# Patient Record
Sex: Female | Born: 1945 | Hispanic: No | Smoking: Never smoker
Health system: Southern US, Community
[De-identification: ages and names within clinical notes are randomized; demographics above are authoritative.]

## PROBLEM LIST (undated history)

## (undated) DIAGNOSIS — I10 Essential (primary) hypertension: Secondary | ICD-10-CM

## (undated) HISTORY — DX: Essential (primary) hypertension: I10

---

## 1999-01-14 ENCOUNTER — Other Ambulatory Visit: Admission: RE | Admit: 1999-01-14 | Discharge: 1999-01-14 | Payer: Self-pay | Admitting: Family Medicine

## 1999-12-16 ENCOUNTER — Other Ambulatory Visit: Admission: RE | Admit: 1999-12-16 | Discharge: 1999-12-16 | Payer: Self-pay | Admitting: *Deleted

## 2003-05-21 ENCOUNTER — Other Ambulatory Visit: Admission: RE | Admit: 2003-05-21 | Discharge: 2003-05-21 | Payer: Self-pay | Admitting: Family Medicine

## 2003-09-27 ENCOUNTER — Ambulatory Visit: Admission: RE | Admit: 2003-09-27 | Discharge: 2003-09-27 | Payer: Self-pay | Admitting: Family Medicine

## 2005-08-04 ENCOUNTER — Other Ambulatory Visit: Admission: RE | Admit: 2005-08-04 | Discharge: 2005-08-04 | Payer: Self-pay | Admitting: Family Medicine

## 2006-09-24 ENCOUNTER — Other Ambulatory Visit: Admission: RE | Admit: 2006-09-24 | Discharge: 2006-09-24 | Payer: Self-pay | Admitting: Family Medicine

## 2007-09-27 ENCOUNTER — Other Ambulatory Visit: Admission: RE | Admit: 2007-09-27 | Discharge: 2007-09-27 | Payer: Self-pay | Admitting: Family Medicine

## 2008-09-28 ENCOUNTER — Other Ambulatory Visit: Admission: RE | Admit: 2008-09-28 | Discharge: 2008-09-28 | Payer: Self-pay | Admitting: Family Medicine

## 2008-11-05 ENCOUNTER — Encounter: Admission: RE | Admit: 2008-11-05 | Discharge: 2008-11-05 | Payer: Self-pay | Admitting: Gastroenterology

## 2009-10-18 ENCOUNTER — Other Ambulatory Visit: Admission: RE | Admit: 2009-10-18 | Discharge: 2009-10-18 | Payer: Self-pay | Admitting: Family Medicine

## 2010-03-18 IMAGING — US US ABDOMEN COMPLETE
1 series · 14 of 25 positions shown · non-contrast
Comparison: None.

CLINICAL DATA: Abdominal pain and weight loss.

COMPLETE ABDOMINAL ULTRASOUND

[Series 1: us abdomen complete · 0.20mm/px · 14 of 86 slices shown]
[im 1/86]
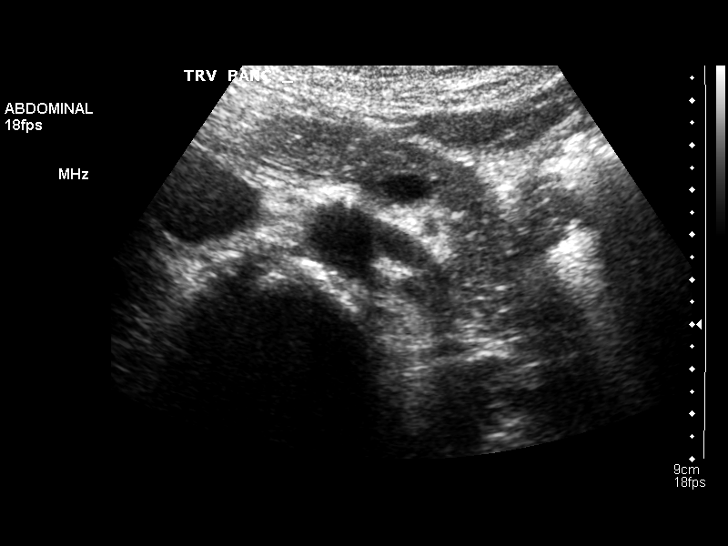
[im 8/86]
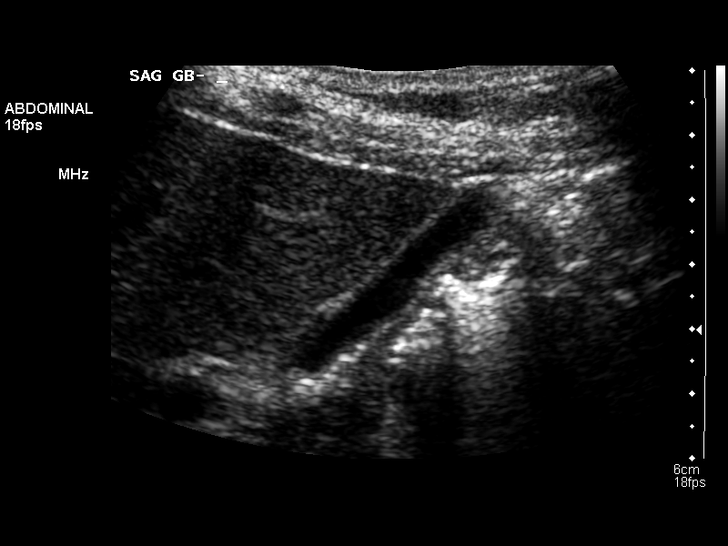
[im 15/86]
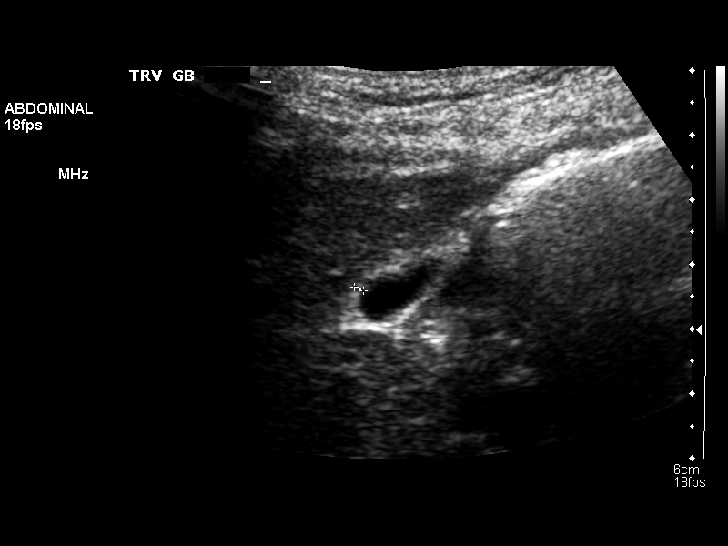
[im 22/86]
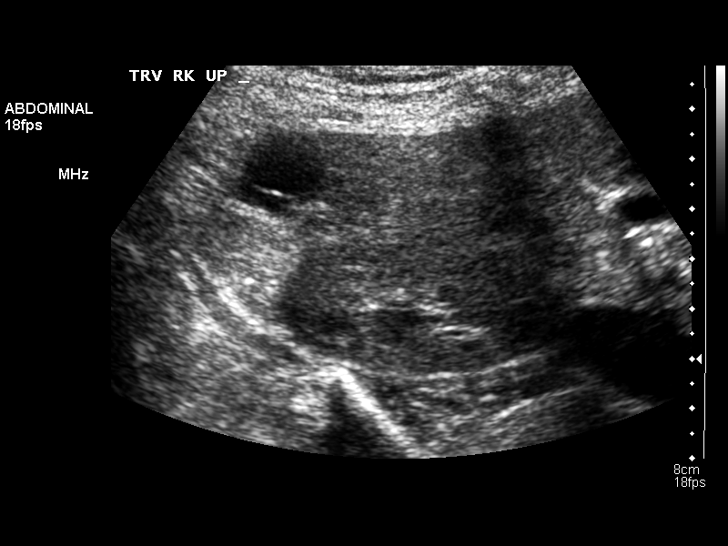
[im 29/86]
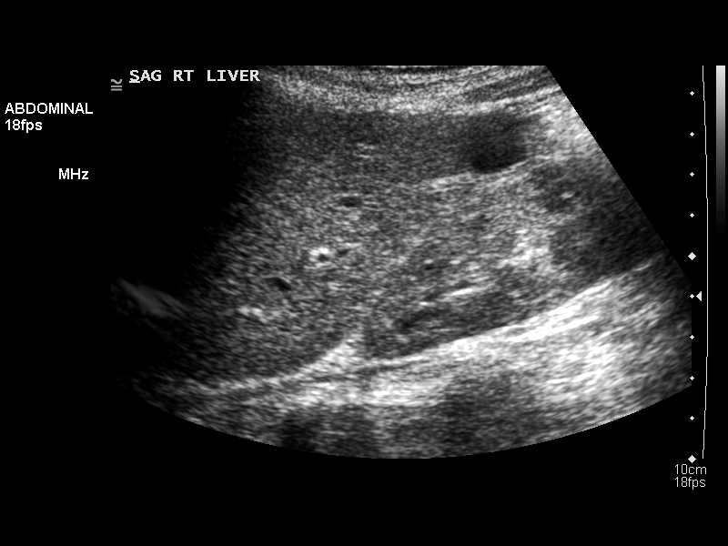
[im 32/86]
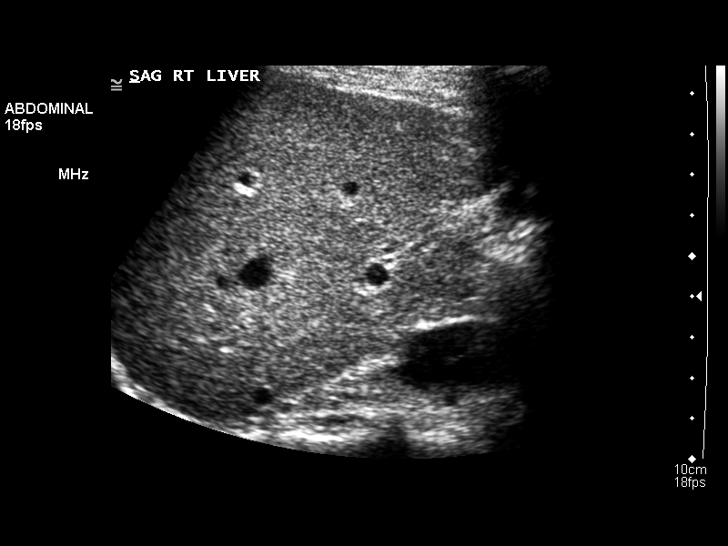
[im 39/86]
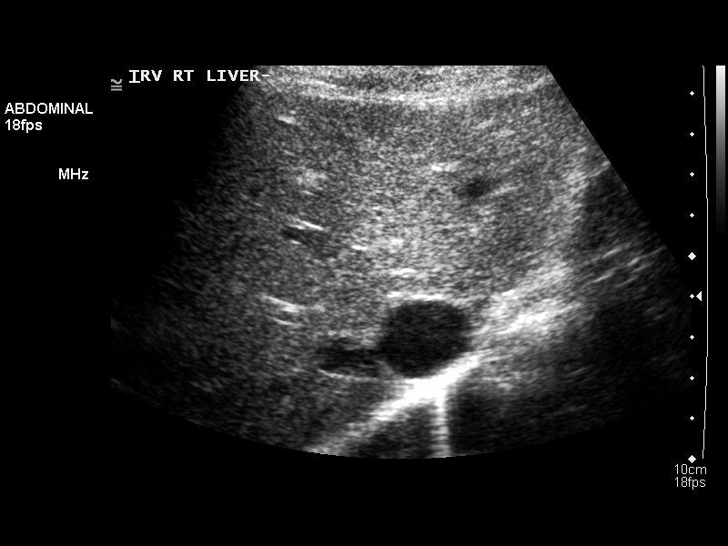
[im 47/86]
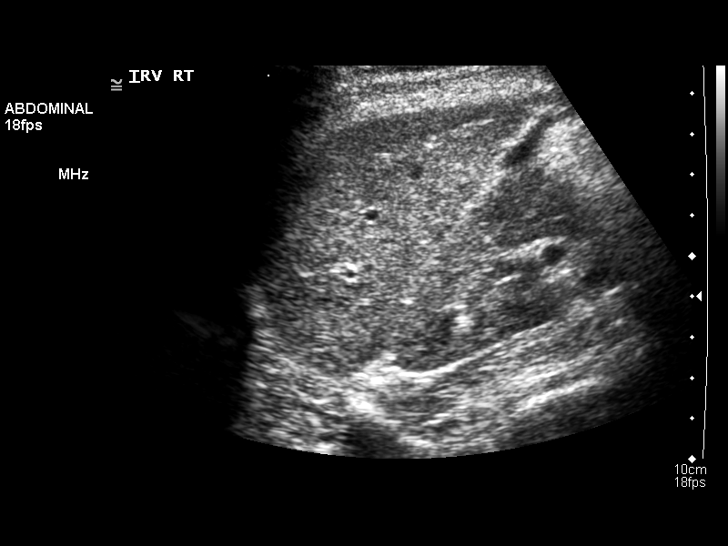
[im 54/86]
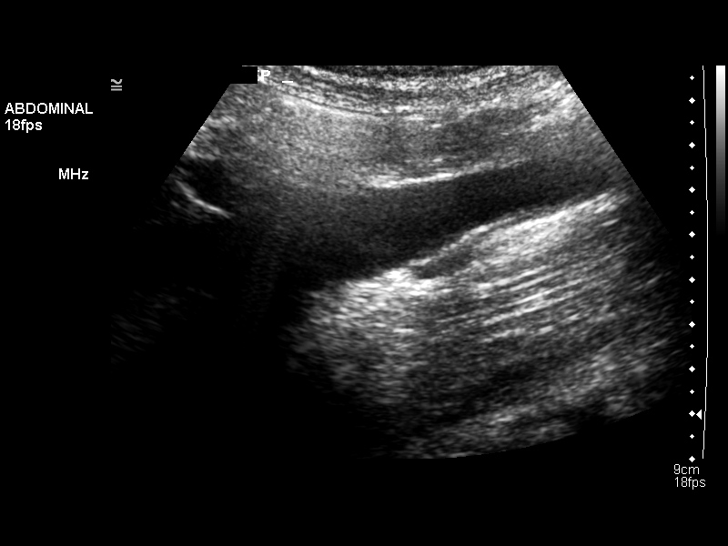
[im 57/86]
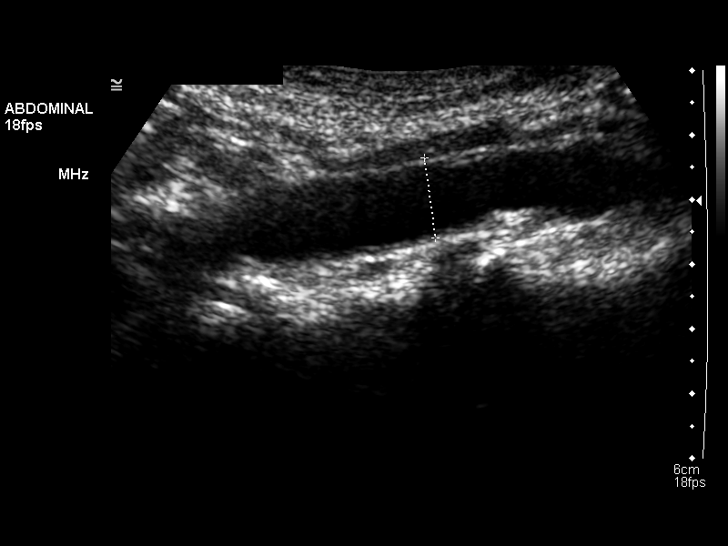
[im 64/86]
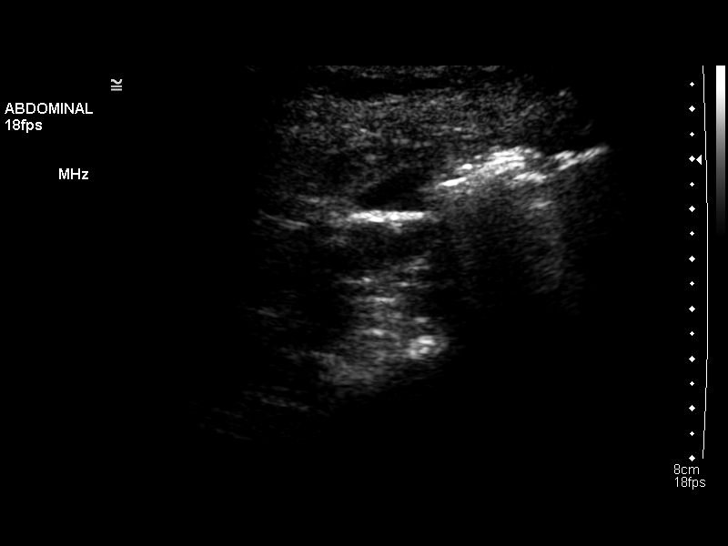
[im 71/86]
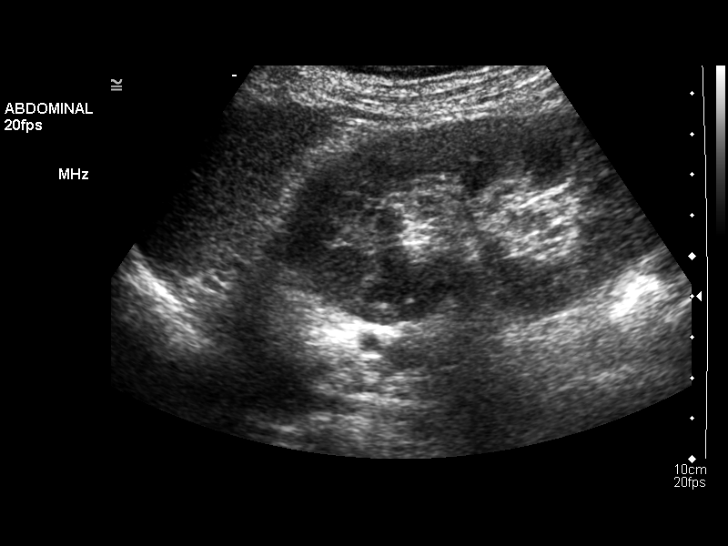
[im 78/86]
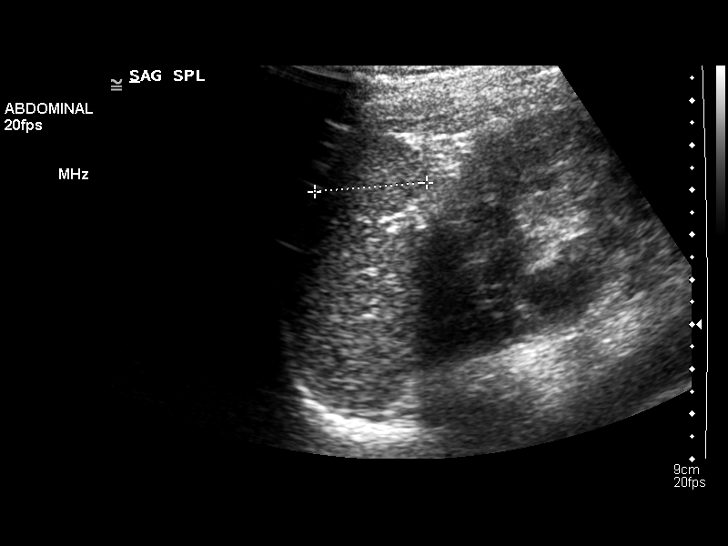
[im 86/86]
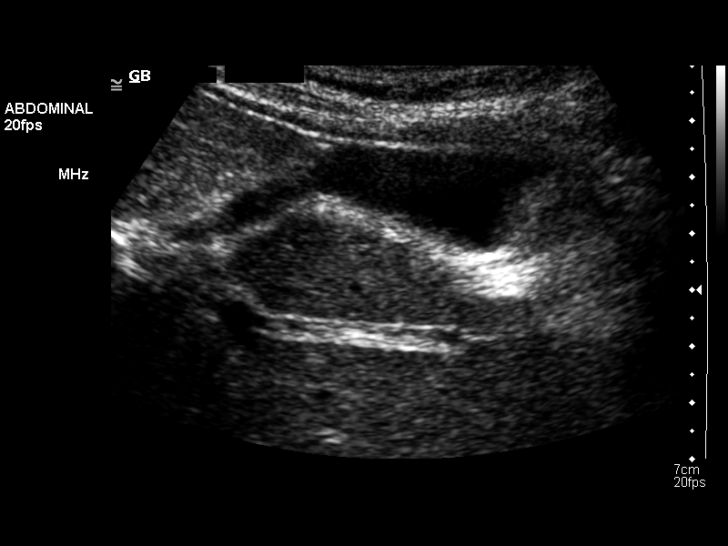

[14 of 25 positions shown; findings below may reference images not displayed]

FINDINGS: Gallbladder:  Negative.

Common bile duct:  3 mm, within normal limits.

Liver:  Contains a 3.1 x 2.0 x 2.3 cm cyst in the right lobe.
Otherwise negative.

IVC:  Visualized.

Pancreas:  Negative.

Spleen:  2.5 cm, negative.

Right Kidney:  10.4 cm, negative.

Left Kidney:  10.5 cm, negative.

Abdominal aorta:  1.8 cm.
IMPRESSION: No acute findings.

## 2011-04-03 ENCOUNTER — Other Ambulatory Visit (HOSPITAL_COMMUNITY)
Admission: RE | Admit: 2011-04-03 | Discharge: 2011-04-03 | Disposition: A | Discharge status: Home / Self Care | Payer: Managed Care, Other (non HMO) | Source: Ambulatory Visit | Attending: Family Medicine | Admitting: Family Medicine

## 2011-04-03 ENCOUNTER — Other Ambulatory Visit: Payer: Self-pay | Admitting: Family Medicine

## 2011-04-03 DIAGNOSIS — Z124 Encounter for screening for malignant neoplasm of cervix: Secondary | ICD-10-CM | POA: Insufficient documentation

## 2012-09-22 ENCOUNTER — Ambulatory Visit
Admission: RE | Admit: 2012-09-22 | Discharge: 2012-09-22 | Disposition: A | Discharge status: Home / Self Care | Payer: Managed Care, Other (non HMO) | Source: Ambulatory Visit

## 2012-09-22 ENCOUNTER — Other Ambulatory Visit: Payer: Self-pay

## 2012-09-22 DIAGNOSIS — R609 Edema, unspecified: Secondary | ICD-10-CM

## 2013-07-11 DIAGNOSIS — M069 Rheumatoid arthritis, unspecified: Secondary | ICD-10-CM | POA: Diagnosis not present

## 2013-07-11 DIAGNOSIS — M159 Polyosteoarthritis, unspecified: Secondary | ICD-10-CM | POA: Diagnosis not present

## 2013-08-14 DIAGNOSIS — J40 Bronchitis, not specified as acute or chronic: Secondary | ICD-10-CM | POA: Diagnosis not present

## 2013-09-11 DIAGNOSIS — M159 Polyosteoarthritis, unspecified: Secondary | ICD-10-CM | POA: Diagnosis not present

## 2013-09-11 DIAGNOSIS — M069 Rheumatoid arthritis, unspecified: Secondary | ICD-10-CM | POA: Diagnosis not present

## 2013-11-27 DIAGNOSIS — H00019 Hordeolum externum unspecified eye, unspecified eyelid: Secondary | ICD-10-CM | POA: Diagnosis not present

## 2013-11-27 DIAGNOSIS — I10 Essential (primary) hypertension: Secondary | ICD-10-CM | POA: Diagnosis not present

## 2013-12-11 DIAGNOSIS — M069 Rheumatoid arthritis, unspecified: Secondary | ICD-10-CM | POA: Diagnosis not present

## 2013-12-11 DIAGNOSIS — M159 Polyosteoarthritis, unspecified: Secondary | ICD-10-CM | POA: Diagnosis not present

## 2013-12-15 DIAGNOSIS — H01009 Unspecified blepharitis unspecified eye, unspecified eyelid: Secondary | ICD-10-CM | POA: Diagnosis not present

## 2013-12-15 DIAGNOSIS — H04129 Dry eye syndrome of unspecified lacrimal gland: Secondary | ICD-10-CM | POA: Diagnosis not present

## 2014-01-12 DIAGNOSIS — H25019 Cortical age-related cataract, unspecified eye: Secondary | ICD-10-CM | POA: Diagnosis not present

## 2014-01-12 DIAGNOSIS — H04129 Dry eye syndrome of unspecified lacrimal gland: Secondary | ICD-10-CM | POA: Diagnosis not present

## 2014-01-12 DIAGNOSIS — H2510 Age-related nuclear cataract, unspecified eye: Secondary | ICD-10-CM | POA: Diagnosis not present

## 2014-02-02 IMAGING — CR DG HAND 2V*L*
2 series · 2 of 2 positions shown · non-contrast
Comparison: None.

CLINICAL DATA: Swelling of the interphalangeal joints.  No trauma.

LEFT HAND - 2 VIEW

[view not recorded (1 of 2)]
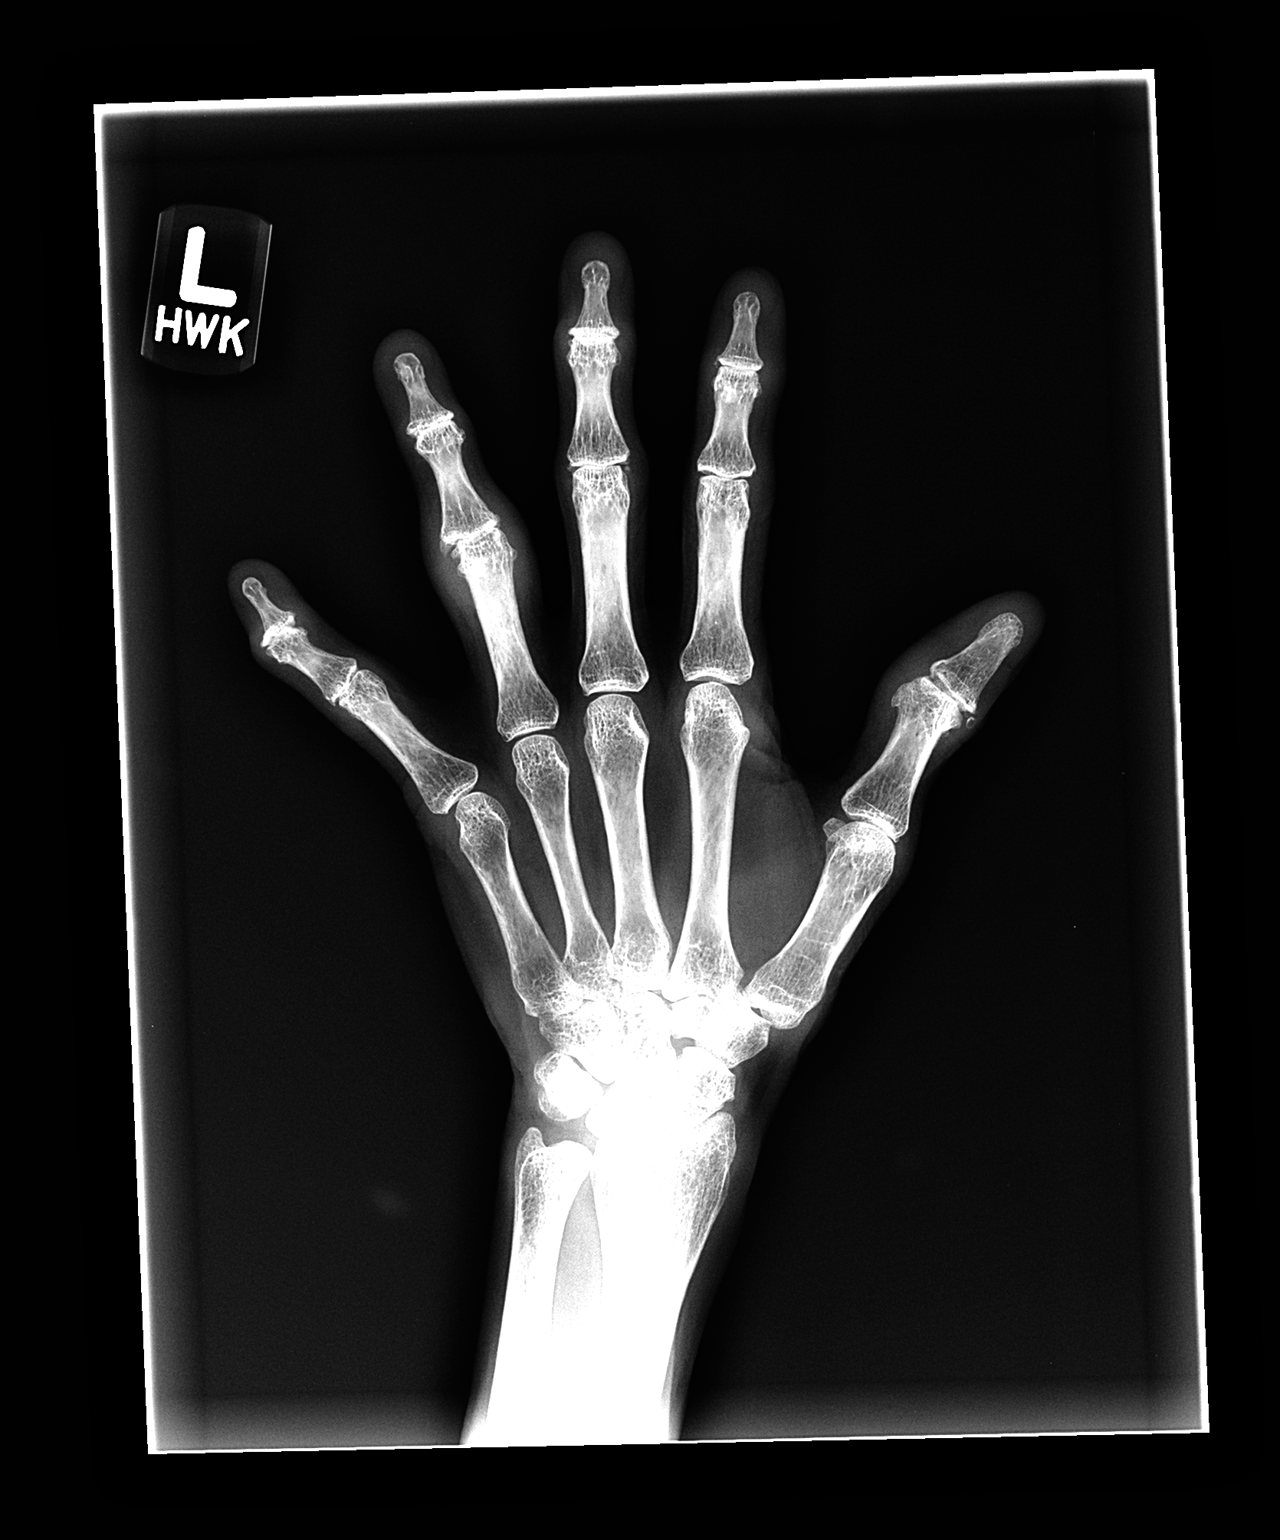

[view not recorded (2 of 2)]
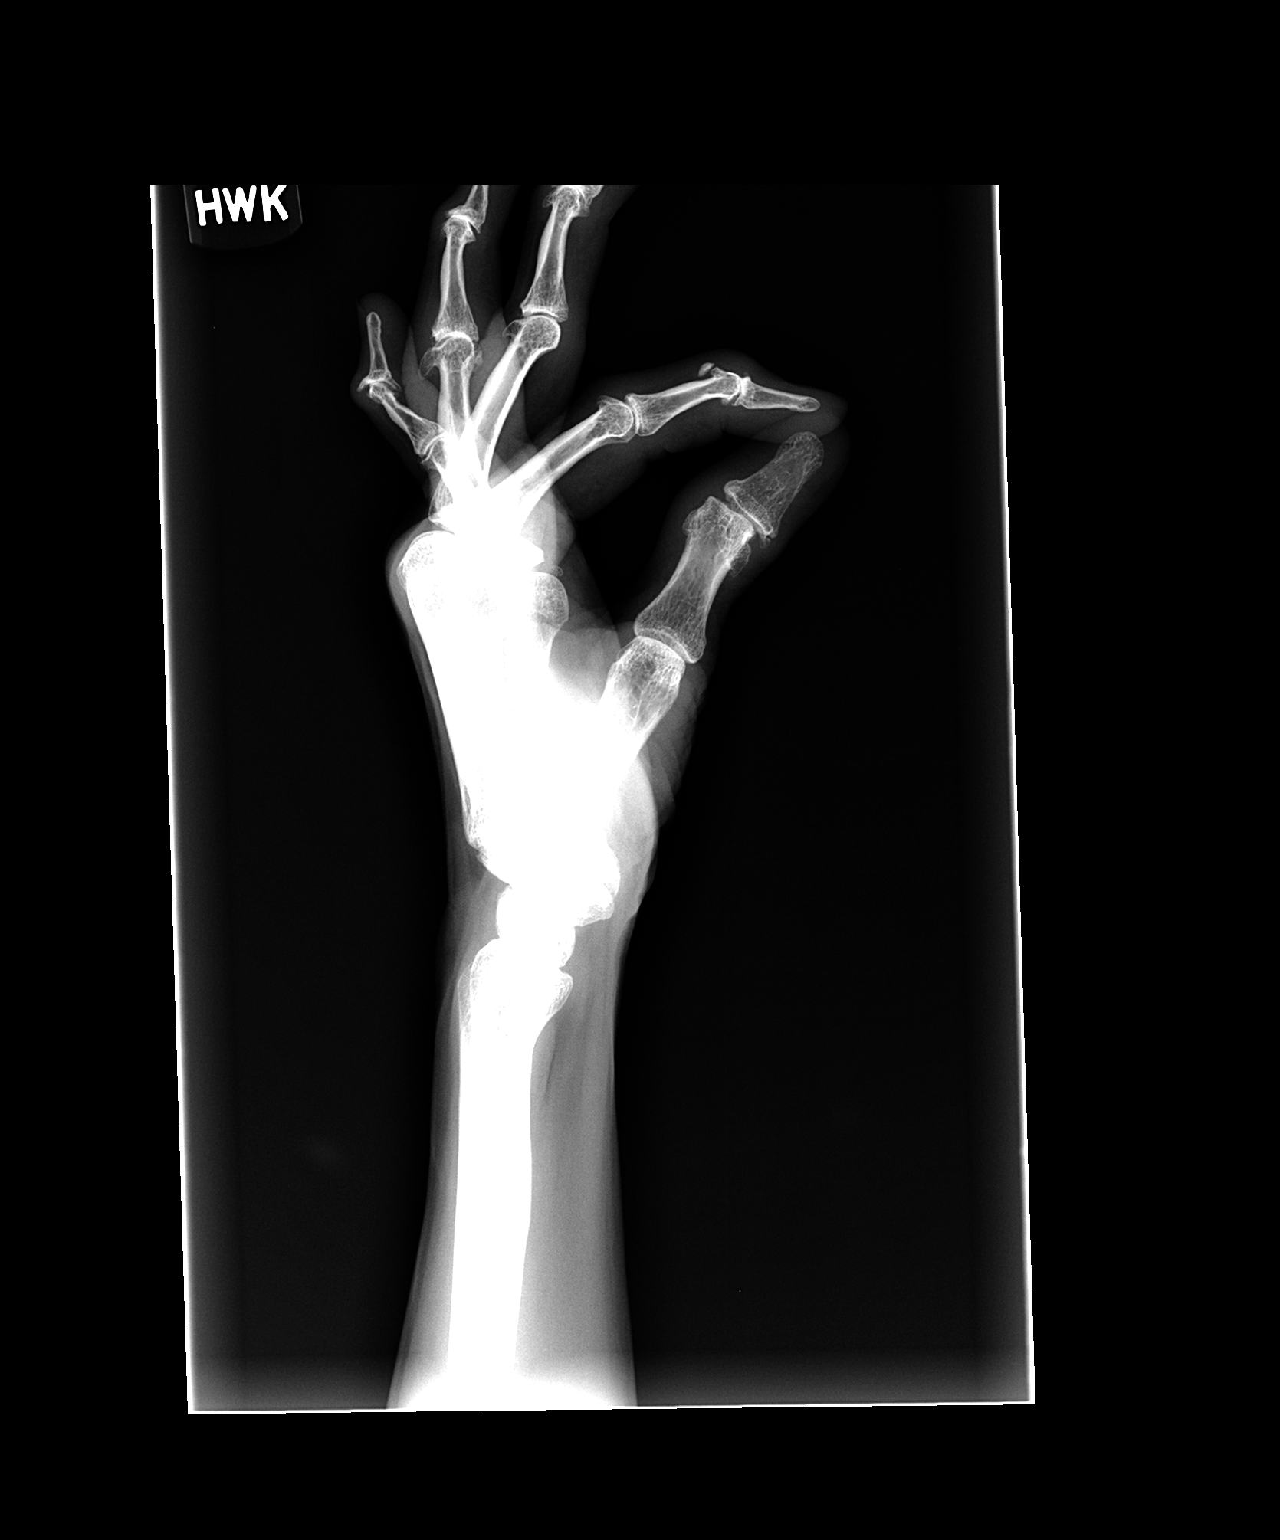

[2 of 2 positions shown; findings below may reference images not displayed]

FINDINGS: There is significant joint space narrowing with
subchondral sclerosis and marginal osteophytes involving all DIP
joints, and the IP joint of the thumb.  The PIP joints are also
affected most prominently the fourth finger, where there is
significant surrounding soft tissue swelling.  The metacarpal
phalangeal joints are preserved as are the intercarpal and carpal
metacarpal articulations.

No fracture.
IMPRESSION: Findings are most consistent with osteoarthritis involving
interphalangeal joints as detailed above.  No fracture or acute
finding.

## 2014-02-16 DIAGNOSIS — Z23 Encounter for immunization: Secondary | ICD-10-CM | POA: Diagnosis not present

## 2014-04-12 DIAGNOSIS — R030 Elevated blood-pressure reading, without diagnosis of hypertension: Secondary | ICD-10-CM | POA: Diagnosis not present

## 2014-05-02 DIAGNOSIS — Z1231 Encounter for screening mammogram for malignant neoplasm of breast: Secondary | ICD-10-CM | POA: Diagnosis not present

## 2014-05-30 DIAGNOSIS — Z131 Encounter for screening for diabetes mellitus: Secondary | ICD-10-CM | POA: Diagnosis not present

## 2014-05-30 DIAGNOSIS — N898 Other specified noninflammatory disorders of vagina: Secondary | ICD-10-CM | POA: Diagnosis not present

## 2014-05-30 DIAGNOSIS — Z23 Encounter for immunization: Secondary | ICD-10-CM | POA: Diagnosis not present

## 2014-05-30 DIAGNOSIS — M069 Rheumatoid arthritis, unspecified: Secondary | ICD-10-CM | POA: Diagnosis not present

## 2014-05-30 DIAGNOSIS — Z136 Encounter for screening for cardiovascular disorders: Secondary | ICD-10-CM | POA: Diagnosis not present

## 2014-05-30 DIAGNOSIS — Z0000 Encounter for general adult medical examination without abnormal findings: Secondary | ICD-10-CM | POA: Diagnosis not present

## 2015-01-14 DIAGNOSIS — H2513 Age-related nuclear cataract, bilateral: Secondary | ICD-10-CM | POA: Diagnosis not present

## 2015-01-14 DIAGNOSIS — H25013 Cortical age-related cataract, bilateral: Secondary | ICD-10-CM | POA: Diagnosis not present

## 2015-01-14 DIAGNOSIS — H04123 Dry eye syndrome of bilateral lacrimal glands: Secondary | ICD-10-CM | POA: Diagnosis not present

## 2015-03-08 DIAGNOSIS — Z23 Encounter for immunization: Secondary | ICD-10-CM | POA: Diagnosis not present

## 2015-03-18 DIAGNOSIS — H25013 Cortical age-related cataract, bilateral: Secondary | ICD-10-CM | POA: Diagnosis not present

## 2015-05-08 DIAGNOSIS — H04121 Dry eye syndrome of right lacrimal gland: Secondary | ICD-10-CM | POA: Diagnosis not present

## 2015-05-08 DIAGNOSIS — H01004 Unspecified blepharitis left upper eyelid: Secondary | ICD-10-CM | POA: Diagnosis not present

## 2015-05-08 DIAGNOSIS — H04122 Dry eye syndrome of left lacrimal gland: Secondary | ICD-10-CM | POA: Diagnosis not present

## 2015-05-08 DIAGNOSIS — H01001 Unspecified blepharitis right upper eyelid: Secondary | ICD-10-CM | POA: Diagnosis not present

## 2015-05-17 DIAGNOSIS — Z1231 Encounter for screening mammogram for malignant neoplasm of breast: Secondary | ICD-10-CM | POA: Diagnosis not present

## 2015-06-12 DIAGNOSIS — I10 Essential (primary) hypertension: Secondary | ICD-10-CM | POA: Diagnosis not present

## 2015-06-12 DIAGNOSIS — M069 Rheumatoid arthritis, unspecified: Secondary | ICD-10-CM | POA: Diagnosis not present

## 2015-06-12 DIAGNOSIS — Z13220 Encounter for screening for lipoid disorders: Secondary | ICD-10-CM | POA: Diagnosis not present

## 2015-06-12 DIAGNOSIS — Z0000 Encounter for general adult medical examination without abnormal findings: Secondary | ICD-10-CM | POA: Diagnosis not present

## 2015-06-12 DIAGNOSIS — Z131 Encounter for screening for diabetes mellitus: Secondary | ICD-10-CM | POA: Diagnosis not present

## 2015-07-08 DIAGNOSIS — M79641 Pain in right hand: Secondary | ICD-10-CM | POA: Diagnosis not present

## 2015-07-08 DIAGNOSIS — M79642 Pain in left hand: Secondary | ICD-10-CM | POA: Diagnosis not present

## 2015-07-08 DIAGNOSIS — H04123 Dry eye syndrome of bilateral lacrimal glands: Secondary | ICD-10-CM | POA: Diagnosis not present

## 2015-07-08 DIAGNOSIS — M2550 Pain in unspecified joint: Secondary | ICD-10-CM | POA: Diagnosis not present

## 2015-07-08 DIAGNOSIS — M150 Primary generalized (osteo)arthritis: Secondary | ICD-10-CM | POA: Diagnosis not present

## 2015-10-02 DIAGNOSIS — H01001 Unspecified blepharitis right upper eyelid: Secondary | ICD-10-CM | POA: Diagnosis not present

## 2015-10-02 DIAGNOSIS — H01002 Unspecified blepharitis right lower eyelid: Secondary | ICD-10-CM | POA: Diagnosis not present

## 2015-10-02 DIAGNOSIS — H04123 Dry eye syndrome of bilateral lacrimal glands: Secondary | ICD-10-CM | POA: Diagnosis not present

## 2015-10-02 DIAGNOSIS — H01004 Unspecified blepharitis left upper eyelid: Secondary | ICD-10-CM | POA: Diagnosis not present

## 2016-01-15 DIAGNOSIS — H04123 Dry eye syndrome of bilateral lacrimal glands: Secondary | ICD-10-CM | POA: Diagnosis not present

## 2016-01-15 DIAGNOSIS — H25013 Cortical age-related cataract, bilateral: Secondary | ICD-10-CM | POA: Diagnosis not present

## 2016-01-15 DIAGNOSIS — H2513 Age-related nuclear cataract, bilateral: Secondary | ICD-10-CM | POA: Diagnosis not present

## 2016-01-15 DIAGNOSIS — H524 Presbyopia: Secondary | ICD-10-CM | POA: Diagnosis not present

## 2016-01-31 DIAGNOSIS — Z23 Encounter for immunization: Secondary | ICD-10-CM | POA: Diagnosis not present

## 2016-04-27 DIAGNOSIS — Z9889 Other specified postprocedural states: Secondary | ICD-10-CM

## 2016-04-27 HISTORY — PX: CATARACT EXTRACTION: SUR2

## 2016-04-27 HISTORY — DX: Other specified postprocedural states: Z98.890

## 2016-05-18 DIAGNOSIS — Z1231 Encounter for screening mammogram for malignant neoplasm of breast: Secondary | ICD-10-CM | POA: Diagnosis not present

## 2016-06-08 DIAGNOSIS — H25013 Cortical age-related cataract, bilateral: Secondary | ICD-10-CM | POA: Diagnosis not present

## 2016-06-08 DIAGNOSIS — H2513 Age-related nuclear cataract, bilateral: Secondary | ICD-10-CM | POA: Diagnosis not present

## 2016-06-12 DIAGNOSIS — I10 Essential (primary) hypertension: Secondary | ICD-10-CM | POA: Diagnosis not present

## 2016-06-12 DIAGNOSIS — Z0000 Encounter for general adult medical examination without abnormal findings: Secondary | ICD-10-CM | POA: Diagnosis not present

## 2016-06-30 DIAGNOSIS — H2511 Age-related nuclear cataract, right eye: Secondary | ICD-10-CM | POA: Diagnosis not present

## 2016-06-30 DIAGNOSIS — H25811 Combined forms of age-related cataract, right eye: Secondary | ICD-10-CM | POA: Diagnosis not present

## 2016-06-30 DIAGNOSIS — H25011 Cortical age-related cataract, right eye: Secondary | ICD-10-CM | POA: Diagnosis not present

## 2016-08-12 DIAGNOSIS — E2839 Other primary ovarian failure: Secondary | ICD-10-CM | POA: Diagnosis not present

## 2016-08-12 DIAGNOSIS — M8588 Other specified disorders of bone density and structure, other site: Secondary | ICD-10-CM | POA: Diagnosis not present

## 2016-08-18 DIAGNOSIS — H25012 Cortical age-related cataract, left eye: Secondary | ICD-10-CM | POA: Diagnosis not present

## 2016-08-18 DIAGNOSIS — H2512 Age-related nuclear cataract, left eye: Secondary | ICD-10-CM | POA: Diagnosis not present

## 2016-08-18 DIAGNOSIS — H25812 Combined forms of age-related cataract, left eye: Secondary | ICD-10-CM | POA: Diagnosis not present

## 2016-12-14 DIAGNOSIS — Z1211 Encounter for screening for malignant neoplasm of colon: Secondary | ICD-10-CM | POA: Diagnosis not present

## 2016-12-14 LAB — HM COLONOSCOPY

## 2017-02-26 DIAGNOSIS — Z23 Encounter for immunization: Secondary | ICD-10-CM | POA: Diagnosis not present

## 2017-04-16 DIAGNOSIS — F411 Generalized anxiety disorder: Secondary | ICD-10-CM | POA: Diagnosis not present

## 2017-04-16 DIAGNOSIS — I10 Essential (primary) hypertension: Secondary | ICD-10-CM | POA: Diagnosis not present

## 2017-05-19 DIAGNOSIS — Z1231 Encounter for screening mammogram for malignant neoplasm of breast: Secondary | ICD-10-CM | POA: Diagnosis not present

## 2017-06-29 DIAGNOSIS — Z5181 Encounter for therapeutic drug level monitoring: Secondary | ICD-10-CM | POA: Diagnosis not present

## 2017-06-29 DIAGNOSIS — M19041 Primary osteoarthritis, right hand: Secondary | ICD-10-CM | POA: Diagnosis not present

## 2017-06-29 DIAGNOSIS — I10 Essential (primary) hypertension: Secondary | ICD-10-CM | POA: Diagnosis not present

## 2017-06-29 DIAGNOSIS — Z0000 Encounter for general adult medical examination without abnormal findings: Secondary | ICD-10-CM | POA: Diagnosis not present

## 2017-06-29 DIAGNOSIS — F411 Generalized anxiety disorder: Secondary | ICD-10-CM | POA: Diagnosis not present

## 2017-06-29 DIAGNOSIS — Z13220 Encounter for screening for lipoid disorders: Secondary | ICD-10-CM | POA: Diagnosis not present

## 2017-09-17 DIAGNOSIS — Z961 Presence of intraocular lens: Secondary | ICD-10-CM | POA: Diagnosis not present

## 2017-09-17 DIAGNOSIS — H04123 Dry eye syndrome of bilateral lacrimal glands: Secondary | ICD-10-CM | POA: Diagnosis not present

## 2018-02-11 DIAGNOSIS — Z23 Encounter for immunization: Secondary | ICD-10-CM | POA: Diagnosis not present

## 2018-05-25 DIAGNOSIS — Z1231 Encounter for screening mammogram for malignant neoplasm of breast: Secondary | ICD-10-CM | POA: Diagnosis not present

## 2018-07-26 DIAGNOSIS — Z0000 Encounter for general adult medical examination without abnormal findings: Secondary | ICD-10-CM | POA: Diagnosis not present

## 2018-07-26 DIAGNOSIS — M19042 Primary osteoarthritis, left hand: Secondary | ICD-10-CM | POA: Diagnosis not present

## 2018-07-26 DIAGNOSIS — F411 Generalized anxiety disorder: Secondary | ICD-10-CM | POA: Diagnosis not present

## 2018-07-26 DIAGNOSIS — I10 Essential (primary) hypertension: Secondary | ICD-10-CM | POA: Diagnosis not present

## 2018-12-29 DIAGNOSIS — H26493 Other secondary cataract, bilateral: Secondary | ICD-10-CM | POA: Diagnosis not present

## 2018-12-29 DIAGNOSIS — H01004 Unspecified blepharitis left upper eyelid: Secondary | ICD-10-CM | POA: Diagnosis not present

## 2018-12-29 DIAGNOSIS — H04123 Dry eye syndrome of bilateral lacrimal glands: Secondary | ICD-10-CM | POA: Diagnosis not present

## 2018-12-29 DIAGNOSIS — H01001 Unspecified blepharitis right upper eyelid: Secondary | ICD-10-CM | POA: Diagnosis not present

## 2019-01-02 DIAGNOSIS — Z23 Encounter for immunization: Secondary | ICD-10-CM | POA: Diagnosis not present

## 2019-05-19 ENCOUNTER — Ambulatory Visit: Payer: Self-pay | Attending: Internal Medicine

## 2019-05-19 DIAGNOSIS — Z23 Encounter for immunization: Secondary | ICD-10-CM | POA: Insufficient documentation

## 2019-05-19 NOTE — Progress Notes (Signed)
   Covid-19 Vaccination Clinic  Name:  Bethany Mcintosh    MRN: [id number] DOB: 20-Mar-1946  05/19/2019  Ms. Bethany Mcintosh was observed post Covid-19 immunization for 15 minutes without incidence. She was provided with Vaccine Information Sheet and instruction to access the V-Safe system.   Ms. Bethany Mcintosh was instructed to call 911 with any severe reactions post vaccine: Marland Kitchen Difficulty breathing  . Swelling of your face and throat  . A fast heartbeat  . A bad rash all over your body  . Dizziness and weakness    Immunizations Administered    Name Date Dose VIS Date Route   Pfizer COVID-19 Vaccine 05/19/2019 12:19 PM 0.3 mL 04/07/2019 Intramuscular   Manufacturer: [employer], [employer]   Lot: [id number]   NDC: [address]-1

## 2019-05-31 DIAGNOSIS — Z1231 Encounter for screening mammogram for malignant neoplasm of breast: Secondary | ICD-10-CM | POA: Diagnosis not present

## 2019-06-09 ENCOUNTER — Ambulatory Visit: Payer: Medicare Other | Attending: Internal Medicine

## 2019-06-09 DIAGNOSIS — Z23 Encounter for immunization: Secondary | ICD-10-CM | POA: Insufficient documentation

## 2019-06-09 NOTE — Progress Notes (Signed)
   Covid-19 Vaccination Clinic  Name:  Bethany Mcintosh    MRN: [id number] DOB: 29-Sep-1945  06/09/2019  [name] was observed post Covid-19 immunization for 15 minutes without incidence. She was provided with Vaccine Information Sheet and instruction to access the V-Safe system.   [name] was instructed to call 911 with any severe reactions post vaccine: Marland Kitchen Difficulty breathing  . Swelling of your face and throat  . A fast heartbeat  . A bad rash all over your body  . Dizziness and weakness    Immunizations Administered    Name Date Dose VIS Date Route   Pfizer COVID-19 Vaccine 06/09/2019 12:47 PM 0.3 mL 04/07/2019 Intramuscular   Manufacturer: [employer], [employer]   Lot: [id number]   NDC: [address]-8

## 2019-08-10 DIAGNOSIS — F411 Generalized anxiety disorder: Secondary | ICD-10-CM | POA: Diagnosis not present

## 2019-08-10 DIAGNOSIS — M19042 Primary osteoarthritis, left hand: Secondary | ICD-10-CM | POA: Diagnosis not present

## 2019-08-10 DIAGNOSIS — Z0000 Encounter for general adult medical examination without abnormal findings: Secondary | ICD-10-CM | POA: Diagnosis not present

## 2019-08-10 DIAGNOSIS — I10 Essential (primary) hypertension: Secondary | ICD-10-CM | POA: Diagnosis not present

## 2019-10-10 DIAGNOSIS — L089 Local infection of the skin and subcutaneous tissue, unspecified: Secondary | ICD-10-CM | POA: Diagnosis not present

## 2020-01-03 DIAGNOSIS — H5202 Hypermetropia, left eye: Secondary | ICD-10-CM | POA: Diagnosis not present

## 2020-01-03 DIAGNOSIS — H01004 Unspecified blepharitis left upper eyelid: Secondary | ICD-10-CM | POA: Diagnosis not present

## 2020-01-03 DIAGNOSIS — H01001 Unspecified blepharitis right upper eyelid: Secondary | ICD-10-CM | POA: Diagnosis not present

## 2020-01-03 DIAGNOSIS — H04123 Dry eye syndrome of bilateral lacrimal glands: Secondary | ICD-10-CM | POA: Diagnosis not present

## 2020-01-24 DIAGNOSIS — Z23 Encounter for immunization: Secondary | ICD-10-CM | POA: Diagnosis not present

## 2020-03-04 DIAGNOSIS — Z23 Encounter for immunization: Secondary | ICD-10-CM | POA: Diagnosis not present

## 2020-04-27 DIAGNOSIS — Z9289 Personal history of other medical treatment: Secondary | ICD-10-CM

## 2020-04-27 HISTORY — DX: Personal history of other medical treatment: Z92.89

## 2020-06-27 DIAGNOSIS — Z1231 Encounter for screening mammogram for malignant neoplasm of breast: Secondary | ICD-10-CM | POA: Diagnosis not present

## 2020-06-27 LAB — HM MAMMOGRAPHY

## 2020-08-14 DIAGNOSIS — I10 Essential (primary) hypertension: Secondary | ICD-10-CM | POA: Diagnosis not present

## 2020-08-14 DIAGNOSIS — Z0000 Encounter for general adult medical examination without abnormal findings: Secondary | ICD-10-CM | POA: Diagnosis not present

## 2020-08-14 DIAGNOSIS — E782 Mixed hyperlipidemia: Secondary | ICD-10-CM | POA: Diagnosis not present

## 2020-11-05 DIAGNOSIS — L989 Disorder of the skin and subcutaneous tissue, unspecified: Secondary | ICD-10-CM | POA: Diagnosis not present

## 2020-11-13 DIAGNOSIS — U071 COVID-19: Secondary | ICD-10-CM | POA: Diagnosis not present

## 2020-11-13 DIAGNOSIS — Z23 Encounter for immunization: Secondary | ICD-10-CM | POA: Diagnosis not present

## 2021-01-06 DIAGNOSIS — H52203 Unspecified astigmatism, bilateral: Secondary | ICD-10-CM | POA: Diagnosis not present

## 2021-01-06 DIAGNOSIS — H26493 Other secondary cataract, bilateral: Secondary | ICD-10-CM | POA: Diagnosis not present

## 2021-01-06 DIAGNOSIS — H04123 Dry eye syndrome of bilateral lacrimal glands: Secondary | ICD-10-CM | POA: Diagnosis not present

## 2021-01-06 DIAGNOSIS — H01001 Unspecified blepharitis right upper eyelid: Secondary | ICD-10-CM | POA: Diagnosis not present

## 2021-01-10 DIAGNOSIS — Z23 Encounter for immunization: Secondary | ICD-10-CM | POA: Diagnosis not present

## 2021-02-05 DIAGNOSIS — Z23 Encounter for immunization: Secondary | ICD-10-CM | POA: Diagnosis not present

## 2021-02-25 DIAGNOSIS — F0390 Unspecified dementia without behavioral disturbance: Secondary | ICD-10-CM | POA: Insufficient documentation

## 2021-03-16 DIAGNOSIS — H6123 Impacted cerumen, bilateral: Secondary | ICD-10-CM | POA: Diagnosis not present

## 2021-04-01 ENCOUNTER — Ambulatory Visit (INDEPENDENT_AMBULATORY_CARE_PROVIDER_SITE_OTHER): Payer: Medicare Other | Admitting: Family

## 2021-04-01 ENCOUNTER — Encounter: Payer: Self-pay | Admitting: Family

## 2021-04-01 ENCOUNTER — Other Ambulatory Visit: Payer: Self-pay

## 2021-04-01 VITALS — BP 140/80 | HR 80 | Temp 98.4°F | Resp 16 | Ht 62.0 in | Wt 85.5 lb

## 2021-04-01 DIAGNOSIS — F419 Anxiety disorder, unspecified: Secondary | ICD-10-CM | POA: Diagnosis not present

## 2021-04-01 DIAGNOSIS — F5101 Primary insomnia: Secondary | ICD-10-CM | POA: Diagnosis not present

## 2021-04-01 DIAGNOSIS — R636 Underweight: Secondary | ICD-10-CM | POA: Diagnosis not present

## 2021-04-01 DIAGNOSIS — Z7689 Persons encountering health services in other specified circumstances: Secondary | ICD-10-CM | POA: Diagnosis not present

## 2021-04-01 DIAGNOSIS — Z1159 Encounter for screening for other viral diseases: Secondary | ICD-10-CM | POA: Diagnosis not present

## 2021-04-01 DIAGNOSIS — F32A Depression, unspecified: Secondary | ICD-10-CM

## 2021-04-01 DIAGNOSIS — Z681 Body mass index (BMI) 19 or less, adult: Secondary | ICD-10-CM | POA: Diagnosis not present

## 2021-04-01 DIAGNOSIS — I10 Essential (primary) hypertension: Secondary | ICD-10-CM

## 2021-04-01 MED ORDER — MELATONIN 3 MG PO TABS: 3.0000 mg | ORAL_TABLET | Freq: Every day | ORAL | 1 refills | Status: AC

## 2021-04-01 MED ORDER — DULOXETINE HCL 20 MG PO CPEP: 20.0000 mg | ORAL_CAPSULE | Freq: Every day | ORAL | 0 refills | Status: DC

## 2021-04-01 NOTE — Progress Notes (Signed)
Provider: Marlowe Sax FNP-C   No primary care provider on file.  Patient Care Team: Katy Apo, MD as Consulting Physician (Ophthalmology)  Extended Emergency Contact Information Primary Emergency Contact: YOW,[name] Address: [place],  [address] Home Phone: [phone] Relation: Son Secondary Emergency Contact: Lucy Chris of [place] Phone: [phone] Mobile Phone: [phone] Relation: Sister  Code Status:  Full Code  Goals of care: Advanced Directive information Advanced Directives 04/01/2021  Does Patient Have a Medical Advance Directive? No  Would patient like information on creating a medical advance directive? No - Patient declined     Chief Complaint  Patient presents with   Establish Care    New Patient.    HPI:  Pt is a 75 y.o. female seen today to establish care at [facility] for medical management of chronic diseases. HPI is obtain with assistance via Translator on the phone.patient speaks and understand some english words responds in English at times during visit.  Has a medical history of Hypertension,Depression with anxiety,Insomnia among others. Takes Amlodipine 5 mg tablet daily.does not check blood pressure at home.denies any headache,dizziness,vision changes,fatigue,chest tightness,palpitation,chest pain or shortness of breath.    Has been on antidepressant but current off.on chart review, note allergic to sertraline no specific type of allergies listed and she does not recall either.  States wakes at night sometimes feeling like someone call her name.Hears talking in her ear despite not seeing anyone.tends to get anxious and depressed.Has her children and lives with daughter but feels like it's better not to be here bothering my children. She denies any suicide ideation or harming self/others.   Past Medical History:  Diagnosis Date   H/O mammogram 2022   patient  does Mammogram annually.   Hx of colonoscopy 2018   2008 as well   Hypertension    Past Surgical History:  Procedure Laterality Date   CATARACT EXTRACTION Bilateral 2018   Katy Apo    Allergies  Allergen Reactions   Sertraline Hcl Other (See Comments)    Allergies as of 04/01/2021       Reactions   Sertraline Hcl Other (See Comments)        Medication List        Accurate as of April 01, 2021 10:46 AM. If you have any questions, ask your nurse or doctor.          amLODipine 5 MG tablet Commonly known as: NORVASC Take 1 tablet by mouth daily.   OVER THE COUNTER MEDICATION Take 1 capsule by mouth daily. Calcium with Vitamin B12 ... 658m.   Please confirm with patient at appointment if Calcium is 6098mand Vitamin B12 MG?   Polyethyl Glycol-Propyl Glycol 0.4-0.3 % Soln Apply 1 drop to eye as needed.        Review of Systems  Constitutional:  Negative for appetite change, chills, fatigue, fever and unexpected weight change.  HENT:  Positive for hearing loss. Negative for congestion, dental problem, ear discharge, ear pain, facial swelling, nosebleeds, postnasal drip, rhinorrhea, sinus pressure, sinus pain, sneezing, sore throat, tinnitus and trouble swallowing.   Eyes:  Positive for visual disturbance. Negative for pain, discharge, redness and itching.       Wears eye glasses   Respiratory:  Negative for cough, chest tightness, shortness of breath and wheezing.   Cardiovascular:  Negative for chest pain, palpitations and leg swelling.  Gastrointestinal:  Negative for abdominal distention, abdominal pain, blood in stool, constipation, diarrhea, nausea and vomiting.  Endocrine: Negative for cold intolerance, heat intolerance, polydipsia, polyphagia and polyuria.  Genitourinary:  Negative for difficulty urinating, dysuria, flank pain, frequency and urgency.  Musculoskeletal:  Negative for arthralgias, back pain, gait problem, joint swelling, myalgias, neck  pain and neck stiffness.  Skin:  Negative for color change, pallor, rash and wound.  Neurological:  Negative for dizziness, syncope, speech difficulty, weakness, light-headedness, numbness and headaches.  Hematological:  Does not bruise/bleed easily.  Psychiatric/Behavioral:  Positive for sleep disturbance. Negative for agitation, behavioral problems, confusion, hallucinations, self-injury and suicidal ideas. The patient is nervous/anxious.        Depression    Immunization History  Administered Date(s) Administered   Influenza Split 04/03/2011, 04/12/2012, 01/02/2019, 01/24/2020   Influenza, High Dose Seasonal PF 01/10/2021   Influenza,inj,Quad PF,6+ Mos 01/31/2016, 02/26/2017, 02/11/2018   Influenza,inj,quad, With Preservative 02/16/2014, 03/08/2015   PFIZER(Purple Top)SARS-COV-2 Vaccination 05/19/2019, 06/09/2019, 03/04/2020, 11/13/2020   Pfizer Covid-19 Vaccine Bivalent Booster 32yr & up 02/05/2021   Pneumococcal Conjugate-13 05/30/2014   Pneumococcal Polysaccharide-23 04/12/2012   Tdap 09/28/2008   Zoster Recombinat (Shingrix) 12/22/2018, 03/01/2019   Zoster, Live 04/28/2012   Pertinent  Health Maintenance Due  Topic Date Due   COLONOSCOPY (Pts 45-45yrInsurance coverage will need to be confirmed)  Never done   MAMMOGRAM  Never done   DEXA SCAN  Never done   INFLUENZA VACCINE  Completed   Fall Risk 04/01/2021  Falls in the past year? 0  Was there an injury with Fall? 0  Fall Risk Category Calculator 0  Fall Risk Category Low  Patient Fall Risk Level Low fall risk  Patient at Risk for Falls Due to No Fall Risks  Fall risk Follow up Falls evaluation completed   Functional Status Survey:    Vitals:   04/01/21 1035  BP: 140/80  Pulse: 80  Resp: 16  Temp: 98.4 F (36.9 C)  SpO2: 97%  Weight: 85 lb 8 oz (38.8 kg)  Height: '5\' 2"'  (1.575 m)   Body mass index is 15.64 kg/m. Physical Exam Vitals reviewed.  Constitutional:      General: She is not in acute  distress.    Appearance: Normal appearance. She is underweight. She is not ill-appearing or diaphoretic.  HENT:     Head: Normocephalic.     Right Ear: Tympanic membrane, ear canal and external ear normal. There is no impacted cerumen.     Left Ear: Tympanic membrane, ear canal and external ear normal. There is no impacted cerumen.     Nose: Nose normal. No congestion or rhinorrhea.     Mouth/Throat:     Mouth: Mucous membranes are moist.     Pharynx: Oropharynx is clear. No oropharyngeal exudate or posterior oropharyngeal erythema.  Eyes:     General: No scleral icterus.       Right eye: No discharge.        Left eye: No discharge.     Extraocular Movements: Extraocular movements intact.     Conjunctiva/sclera: Conjunctivae normal.     Pupils: Pupils are equal, round, and reactive to light.  Neck:     Vascular: No carotid bruit.  Cardiovascular:     Rate and Rhythm: Normal rate and regular rhythm.     Pulses: Normal pulses.     Heart sounds: Normal heart sounds. No murmur heard.   No friction rub. No gallop.  Pulmonary:     Effort: Pulmonary  effort is normal. No respiratory distress.     Breath sounds: Normal breath sounds. No wheezing, rhonchi or rales.  Chest:     Chest wall: No tenderness.  Abdominal:     General: Bowel sounds are normal. There is no distension.     Palpations: Abdomen is soft. There is no mass.     Tenderness: There is no abdominal tenderness. There is no right CVA tenderness, left CVA tenderness, guarding or rebound.  Musculoskeletal:        General: No swelling or tenderness. Normal range of motion.     Cervical back: Normal range of motion. No rigidity or tenderness.     Right lower leg: No edema.     Left lower leg: No edema.  Lymphadenopathy:     Cervical: No cervical adenopathy.  Skin:    General: Skin is warm and dry.     Coloration: Skin is not pale.     Findings: No bruising, erythema, lesion or rash.  Neurological:     Mental Status: She is  alert and oriented to person, place, and time.     Cranial Nerves: No cranial nerve deficit.     Sensory: No sensory deficit.     Motor: No weakness.     Coordination: Coordination normal.     Gait: Gait normal.  Psychiatric:        Mood and Affect: Mood is anxious and depressed.        Speech: Speech normal.        Behavior: Behavior normal.        Thought Content: Thought content normal.        Judgment: Judgment normal.    Labs reviewed: No results for input(s): NA, K, CL, CO2, GLUCOSE, BUN, CREATININE, CALCIUM, MG, PHOS in the last 8760 hours. No results for input(s): AST, ALT, ALKPHOS, BILITOT, PROT, ALBUMIN in the last 8760 hours. No results for input(s): WBC, NEUTROABS, HGB, HCT, MCV, PLT in the last 8760 hours. No results found for: TSH No results found for: HGBA1C No results found for: CHOL, HDL, LDLCALC, LDLDIRECT, TRIG, CHOLHDL  Significant Diagnostic Results in last 30 days:  No results found.  Assessment/Plan  1. Establishing care with new doctor, encounter for Limited available medical records for review.will obtain records then update colonoscopy,mammogram ,Bone density and Tdap vaccine   2. Essential hypertension, benign B/p not at goal.Being new patient and given her anxiety will not increase her amlodipine this visit.will follow up in 1 month if still high increase amlodipine to 10 mg tablet daily.  No home readings for evaluation. - will continue on Amlodipine 5 mg tablet will increase dose if B/p > 140/90  - will need fasting labs next visit in one month.  - CBC with Differential/Platelet; Future - CMP with eGFR([facility]); Future - TSH; Future - Lipid panel; Future  3. Anxiety and depression Chronic  Off medication.Note allergies to sertraline.will avoid all other SSRI's  Start on Duloxetine then follow up in one month to re-evaluate then will adjust dosage if needed. - continue to monitor mood   - DULoxetine (CYMBALTA) 20 MG capsule; Take 1 capsule (20  mg total) by mouth daily.  Dispense: 30 capsule; Refill: 0 - TSH; Future  4. Primary insomnia Advised to take melatonin may repeat dose if still unable to sleep.  - melatonin 3 MG TABS tablet; Take 1 tablet (3 mg total) by mouth at bedtime.  Dispense: 90 tablet; Refill: 1  5. Encounter for hepatitis C screening  test for low risk patient Low risk  - Hep C Antibody; Future  6. Underweight BMI 15.64  Dietary modification advised.Encouraged to drink Ensure one by mouth daily   7. Body mass index (BMI) of 19 or less in adult BMI 15.64  Ensure supplement as above.   Family/ staff Communication: Reviewed plan of care with patient verbalized understanding   Labs/tests ordered:  - CBC with Differential/Platelet - CMP with eGFR([facility]) - TSH - Lipid panel - Hep C Antibody  Next Appointment : one month for depression and anxiety evaluation and Fasting labs.   Sandrea Hughs, NP

## 2021-04-01 NOTE — Patient Instructions (Addendum)
-   Take Duloxetine 20 mg tablet one by mouth daily at bedtime for anxiety and depression. - Take Melatonin 3  to 6 mg tablet by mouth daily at bedtime for insomnia.

## 2021-04-06 DIAGNOSIS — Z681 Body mass index (BMI) 19 or less, adult: Secondary | ICD-10-CM | POA: Insufficient documentation

## 2021-04-06 DIAGNOSIS — I10 Essential (primary) hypertension: Secondary | ICD-10-CM | POA: Insufficient documentation

## 2021-04-06 DIAGNOSIS — R636 Underweight: Secondary | ICD-10-CM | POA: Insufficient documentation

## 2021-04-06 DIAGNOSIS — F419 Anxiety disorder, unspecified: Secondary | ICD-10-CM | POA: Insufficient documentation

## 2021-04-06 DIAGNOSIS — F32A Depression, unspecified: Secondary | ICD-10-CM | POA: Insufficient documentation

## 2021-04-24 ENCOUNTER — Ambulatory Visit: Payer: Self-pay | Admitting: Orthopedic Surgery

## 2021-04-27 ENCOUNTER — Other Ambulatory Visit: Payer: Self-pay | Admitting: Family

## 2021-04-27 DIAGNOSIS — F32A Depression, unspecified: Secondary | ICD-10-CM

## 2021-04-27 DIAGNOSIS — F419 Anxiety disorder, unspecified: Secondary | ICD-10-CM

## 2021-05-02 ENCOUNTER — Other Ambulatory Visit: Payer: Medicare Other

## 2021-05-02 ENCOUNTER — Other Ambulatory Visit: Payer: Self-pay

## 2021-05-02 DIAGNOSIS — F419 Anxiety disorder, unspecified: Secondary | ICD-10-CM | POA: Diagnosis not present

## 2021-05-02 DIAGNOSIS — Z1159 Encounter for screening for other viral diseases: Secondary | ICD-10-CM | POA: Diagnosis not present

## 2021-05-02 DIAGNOSIS — F32A Depression, unspecified: Secondary | ICD-10-CM

## 2021-05-02 DIAGNOSIS — I10 Essential (primary) hypertension: Secondary | ICD-10-CM | POA: Diagnosis not present

## 2021-05-06 ENCOUNTER — Other Ambulatory Visit: Payer: Self-pay

## 2021-05-06 ENCOUNTER — Ambulatory Visit (INDEPENDENT_AMBULATORY_CARE_PROVIDER_SITE_OTHER): Payer: Medicare Other | Admitting: Family

## 2021-05-06 ENCOUNTER — Encounter: Payer: Self-pay | Admitting: Family

## 2021-05-06 ENCOUNTER — Encounter: Payer: Self-pay | Admitting: *Deleted

## 2021-05-06 VITALS — BP 134/82 | HR 95 | Temp 97.2°F | Resp 16 | Ht 62.0 in | Wt 86.0 lb

## 2021-05-06 DIAGNOSIS — K752 Nonspecific reactive hepatitis: Secondary | ICD-10-CM

## 2021-05-06 DIAGNOSIS — Z780 Asymptomatic menopausal state: Secondary | ICD-10-CM | POA: Diagnosis not present

## 2021-05-06 DIAGNOSIS — R440 Auditory hallucinations: Secondary | ICD-10-CM

## 2021-05-06 DIAGNOSIS — E785 Hyperlipidemia, unspecified: Secondary | ICD-10-CM

## 2021-05-06 DIAGNOSIS — D709 Neutropenia, unspecified: Secondary | ICD-10-CM | POA: Diagnosis not present

## 2021-05-06 MED ORDER — VITAMIN C-ROSE HIPS 500 MG PO TABS: 500.0000 mg | ORAL_TABLET | Freq: Every day | ORAL | 0 refills | Status: AC

## 2021-05-06 NOTE — Patient Instructions (Signed)
-   Please call psychiatrist service to schedule appointment for evaluation of Auditory voices  Numbers provided today.  - Referral ordered today for Infectious Disease for evaluation of Reactive Hep C labs.Infectious disease office will call you for appointment

## 2021-05-06 NOTE — Progress Notes (Signed)
Provider: Marlowe Sax FNP-C  Nero Sawatzky, Nelda Bucks, NP  Patient Care Team: Marvina Danner, Nelda Bucks, NP as PCP - General (Family Medicine) Katy Apo, MD as Consulting Physician (Ophthalmology)  Extended Emergency Contact Information Primary Emergency Contact: YOW,[name] Address: [address] [place],  [id number] Home Phone: [phone] Relation: Son Secondary Emergency Contact: Lucy Chris of [place] Phone: [phone] Mobile Phone: [phone] Relation: Sister  Code Status:  DNR Goals of care: Advanced Directive information Advanced Directives 05/06/2021  Does Patient Have a Medical Advance Directive? No  Would patient like information on creating a medical advance directive? No - Patient declined     Chief Complaint  Patient presents with   Medical Management of Chronic Issues    1 month follow up/Discuss labs.   Health Maintenance    Discuss the need for Colonoscopy, Mammogram, and Dexa Scan.   Immunizations    Discuss the need for Tetanus vaccine.     HPI:  Pt is a 76 y.o. female seen today for an acute visit for one month for follow up depression.HPI provided by patient with assistance from [facility] health translator on speaker phone.she was started on Cymbalta 20 mg tablet last visit.states feeling much better but still hearing voices in her head especially whenever she is alone.when she talks to people she does not hear any voices.  Does wear hearing aids. States had mammogram and bone density done at [facility].Also had colonoscopy in 2018 with [facility]  Recent lab work reviewed and discussed with patient. WBC was 2.5 advised to use vitamin 500 mg tablet C daily  Total cholesterol was 209,TRG normal,LDL was 106 dietary changes discussed.  Hepatitis results was reactive.reports no hx of transfusion or high risk behaviors.     Past Medical History:  Diagnosis Date   H/O mammogram 2022   patient does  Mammogram annually.   Hx of colonoscopy 2018   2008 as well   Hypertension    Past Surgical History:  Procedure Laterality Date   CATARACT EXTRACTION Bilateral 2018   Katy Apo    Allergies  Allergen Reactions   Sertraline Hcl Other (See Comments)    Outpatient Encounter Medications as of 05/06/2021  Medication Sig   amLODipine (NORVASC) 5 MG tablet Take 1 tablet by mouth daily.   DULoxetine (CYMBALTA) 20 MG capsule Take 1 capsule by mouth once daily   melatonin 3 MG TABS tablet Take 1 tablet (3 mg total) by mouth at bedtime.   OVER THE COUNTER MEDICATION Take 1 capsule by mouth daily. Calcium with Vitamin B12 ... 600mg .   Please confirm with patient at appointment if Calcium is 600mg  and Vitamin B12 MG?   Polyethyl Glycol-Propyl Glycol 0.4-0.3 % SOLN Apply 1 drop to eye as needed.   No facility-administered encounter medications on file as of 05/06/2021.    Review of Systems  Constitutional:  Negative for appetite change, chills, fatigue, fever and unexpected weight change.  HENT:  Negative for congestion, dental problem, ear discharge, ear pain, facial swelling, hearing loss, nosebleeds, postnasal drip, rhinorrhea, sinus pressure, sinus pain, sneezing, sore throat, tinnitus and trouble swallowing.   Eyes:  Positive for visual disturbance. Negative for pain, discharge, redness and itching.       Wears eye glasses   Respiratory:  Negative for cough, chest tightness, shortness of breath and wheezing.   Cardiovascular:  Negative for chest pain, palpitations and leg swelling.  Gastrointestinal:  Negative for abdominal distention, abdominal pain, blood in stool, constipation, diarrhea, nausea and vomiting.  Genitourinary:  Negative for difficulty urinating, dysuria, flank pain, frequency and urgency.  Musculoskeletal:  Negative for arthralgias, back pain, gait problem, joint swelling, myalgias, neck pain and neck stiffness.  Skin:  Negative for color change, pallor and rash.   Neurological:  Negative for dizziness, syncope, speech difficulty, weakness, light-headedness, numbness and headaches.  Hematological:  Does not bruise/bleed easily.  Psychiatric/Behavioral:  Positive for hallucinations. Negative for agitation, behavioral problems, confusion, self-injury, sleep disturbance and suicidal ideas. The patient is not nervous/anxious.        Depression has improved but still has hears voices in her head    Immunization History  Administered Date(s) Administered   Influenza Split 04/03/2011, 04/12/2012, 01/02/2019, 01/24/2020   Influenza, High Dose Seasonal PF 01/10/2021   Influenza,inj,Quad PF,6+ Mos 01/31/2016, 02/26/2017, 02/11/2018   Influenza,inj,quad, With Preservative 02/16/2014, 03/08/2015   PFIZER(Purple Top)SARS-COV-2 Vaccination 05/19/2019, 06/09/2019, 03/04/2020, 11/13/2020   Pfizer Covid-19 Vaccine Bivalent Booster 564yrs & up 02/05/2021   Pneumococcal Conjugate-13 05/30/2014   Pneumococcal Polysaccharide-23 04/12/2012   Tdap 09/28/2008   Zoster Recombinat (Shingrix) 12/22/2018, 03/01/2019   Zoster, Live 04/28/2012   Pertinent  Health Maintenance Due  Topic Date Due   COLONOSCOPY (Pts 45-386yrs Insurance coverage will need to be confirmed)  Never done   MAMMOGRAM  Never done   DEXA SCAN  Never done   INFLUENZA VACCINE  Completed   Fall Risk 04/01/2021 05/06/2021  Falls in the past year? 0 0  Was there an injury with Fall? 0 0  Fall Risk Category Calculator 0 0  Fall Risk Category Low Low  Patient Fall Risk Level Low fall risk Low fall risk  Patient at Risk for Falls Due to No Fall Risks No Fall Risks  Fall risk Follow up Falls evaluation completed Falls evaluation completed   Functional Status Survey:    Vitals:   05/06/21 1005  BP: 134/82  Resp: 16  Temp: (!) 97.2 F (36.2 C)  Weight: 86 lb (39 kg)  Height: 5\' 2"  (1.575 m)   Body mass index is 15.73 kg/m. Physical Exam Vitals reviewed.  Constitutional:      General: She is not  in acute distress.    Appearance: Normal appearance. She is underweight. She is not ill-appearing or diaphoretic.  HENT:     Head: Normocephalic.     Right Ear: Tympanic membrane, ear canal and external ear normal. There is no impacted cerumen.     Left Ear: Tympanic membrane, ear canal and external ear normal. There is no impacted cerumen.     Nose: Nose normal. No congestion or rhinorrhea.     Mouth/Throat:     Mouth: Mucous membranes are moist.     Pharynx: Oropharynx is clear. No oropharyngeal exudate or posterior oropharyngeal erythema.  Eyes:     General: No scleral icterus.       Right eye: No discharge.        Left eye: No discharge.     Extraocular Movements: Extraocular movements intact.     Conjunctiva/sclera: Conjunctivae normal.     Pupils: Pupils are equal, round, and reactive to light.  Neck:     Vascular: No carotid bruit.  Cardiovascular:     Rate and Rhythm: Normal rate and regular rhythm.     Pulses: Normal pulses.     Heart sounds: Normal heart sounds. No murmur heard.   No friction rub. No gallop.  Pulmonary:     Effort: Pulmonary effort is normal. No respiratory distress.     Breath sounds: Normal breath sounds. No wheezing, rhonchi or rales.  Chest:     Chest wall: No tenderness.  Abdominal:     General: Bowel sounds are normal. There is no distension.     Palpations: Abdomen is soft. There is no mass.     Tenderness: There is no abdominal tenderness. There is no right CVA tenderness, left CVA tenderness, guarding or rebound.  Musculoskeletal:        General: No swelling or tenderness. Normal range of motion.     Cervical back: Normal range of motion. No rigidity or tenderness.     Right lower leg: No edema.     Left lower leg: No edema.  Lymphadenopathy:     Cervical: No cervical adenopathy.  Skin:    General: Skin is warm and dry.     Coloration: Skin is not pale.     Findings: No bruising, erythema, lesion or rash.  Neurological:     Mental  Status: She is alert and oriented to person, place, and time.     Cranial Nerves: No cranial nerve deficit.     Sensory: No sensory deficit.     Motor: No weakness.     Coordination: Coordination normal.     Gait: Gait normal.  Psychiatric:        Speech: Speech normal.        Behavior: Behavior normal.        Thought Content: Thought content normal.        Judgment: Judgment normal.     Comments: Depression has improved     Labs reviewed: Recent Labs    05/02/21 0820  NA 141  K 3.9  CL 103  CO2 29  GLUCOSE 86  BUN 11  CREATININE 0.66  CALCIUM 9.9   Recent Labs    05/02/21 0820  AST 23  ALT 21  BILITOT 0.7  PROT 8.1   Recent Labs    05/02/21 0820  WBC 2.5*  NEUTROABS 1,180*  HGB 14.1  HCT 42.4  MCV 94.4  PLT 210   Lab Results  Component Value Date   TSH 2.06 05/02/2021   No results found for: HGBA1C Lab Results  Component Value Date   CHOL 209 (H) 05/02/2021   HDL 87 05/02/2021   LDLCALC 106 (H) 05/02/2021   TRIG 70 05/02/2021   CHOLHDL 2.4 05/02/2021    Significant Diagnostic Results in last 30 days:  No results found.  Assessment/Plan  1. Hyperlipidemia LDL goal <100 LDL 106  - Dietary modification advised   2. 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  - Advised to call psychiatrist service to schedule appointment for evaluation of Auditory voices  Numbers provided today. - Ambulatory referral to Psychiatry  4. Neutropenia, unspecified type ([place]) WBC 2.5  Start on Vitamin C as below - Ascorbic Acid (VITAMIN C WITH ROSE HIPS) 500 MG tablet; Take 1 tablet (500 mg total) by mouth daily.  Dispense: 90 tablet; Refill: 0  5. Postmenopausal estrogen deficiency No recent fall or fracture  - DG Bone Density;  Future  Family/ staff Communication: Reviewed plan of care with patient via translator verbalized understanding   Labs/tests  ordered: - DG Bone Density; Future  Next Appointment: 4 months for medical management of chronic issues  Sandrea Hughs, NP

## 2021-05-08 LAB — COMPLETE METABOLIC PANEL WITH GFR
AG Ratio: 1.5 (calc) (ref 1.0–2.5)
ALT: 21 U/L (ref 6–29)
AST: 23 U/L (ref 10–35)
Albumin: 4.8 g/dL (ref 3.6–5.1)
Alkaline phosphatase (APISO): 56 U/L (ref 37–153)
BUN: 11 mg/dL (ref 7–25)
CO2: 29 mmol/L (ref 20–32)
Calcium: 9.9 mg/dL (ref 8.6–10.4)
Chloride: 103 mmol/L (ref 98–110)
Creat: 0.66 mg/dL (ref 0.60–1.00)
Globulin: 3.3 g/dL (calc) (ref 1.9–3.7)
Glucose, Bld: 86 mg/dL (ref 65–99)
Potassium: 3.9 mmol/L (ref 3.5–5.3)
Sodium: 141 mmol/L (ref 135–146)
Total Bilirubin: 0.7 mg/dL (ref 0.2–1.2)
Total Protein: 8.1 g/dL (ref 6.1–8.1)
eGFR: 92 mL/min/{1.73_m2} (ref >=60)

## 2021-05-08 LAB — LIPID PANEL
Cholesterol: 209 mg/dL — ABNORMAL HIGH (ref <200)
HDL: 87 mg/dL (ref >=50)
LDL Cholesterol (Calc): 106 mg/dL (calc) — ABNORMAL HIGH
Non-HDL Cholesterol (Calc): 122 mg/dL (calc) (ref <130)
Total CHOL/HDL Ratio: 2.4 (calc) (ref <5.0)
Triglycerides: 70 mg/dL (ref <150)

## 2021-05-08 LAB — CBC WITH DIFFERENTIAL/PLATELET
Absolute Monocytes: 288 cells/uL (ref 200–950)
Basophils Absolute: 50 cells/uL (ref 0–200)
Basophils Relative: 2 %
Eosinophils Absolute: 60 cells/uL (ref 15–500)
Eosinophils Relative: 2.4 %
HCT: 42.4 % (ref 35.0–45.0)
Hemoglobin: 14.1 g/dL (ref 11.7–15.5)
Lymphs Abs: 923 cells/uL (ref 850–3900)
MCH: 31.4 pg (ref 27.0–33.0)
MCHC: 33.3 g/dL (ref 32.0–36.0)
MCV: 94.4 fL (ref 80.0–100.0)
MPV: 10.3 fL (ref 7.5–12.5)
Monocytes Relative: 11.5 %
Neutro Abs: 1180 cells/uL — ABNORMAL LOW (ref 1500–7800)
Neutrophils Relative %: 47.2 %
Platelets: 210 10*3/uL (ref 140–400)
RBC: 4.49 10*6/uL (ref 3.80–5.10)
RDW: 11.8 % (ref 11.0–15.0)
Total Lymphocyte: 36.9 %
WBC: 2.5 10*3/uL — ABNORMAL LOW (ref 3.8–10.8)

## 2021-05-08 LAB — HEPATITIS C ANTIBODY
Hepatitis C Ab: REACTIVE — AB
SIGNAL TO CUT-OFF: 2.72 — ABNORMAL HIGH (ref <1.00)

## 2021-05-08 LAB — HCV RNA,QUANTITATIVE REAL TIME PCR
HCV Quantitative Log: <1.18 Log IU/mL
HCV RNA, PCR, QN: <15 IU/mL

## 2021-05-08 LAB — TSH: TSH: 2.06 mIU/L (ref 0.40–4.50)

## 2021-05-13 ENCOUNTER — Ambulatory Visit: Payer: Medicare Other | Admitting: Internal Medicine

## 2021-06-30 DIAGNOSIS — Z1231 Encounter for screening mammogram for malignant neoplasm of breast: Secondary | ICD-10-CM | POA: Diagnosis not present

## 2021-09-04 ENCOUNTER — Ambulatory Visit: Payer: Medicare Other | Admitting: Family

## 2021-09-04 NOTE — Patient Instructions (Signed)
Please contact your local pharmacy, previous provider, or insurance carrier for vaccine/immunization records. Ensure that any procedures done outside of [facility] are faxed to us [phone] or you can sign release of records form at the front desk to keep your medical record updated.   ?

## 2021-09-05 ENCOUNTER — Encounter: Payer: Self-pay | Admitting: Family

## 2021-09-05 ENCOUNTER — Ambulatory Visit (INDEPENDENT_AMBULATORY_CARE_PROVIDER_SITE_OTHER): Payer: Medicare Other | Admitting: Family

## 2021-09-05 VITALS — BP 110/64 | HR 86 | Temp 97.8°F | Resp 16 | Ht 62.0 in | Wt 87.8 lb

## 2021-09-05 DIAGNOSIS — F5101 Primary insomnia: Secondary | ICD-10-CM | POA: Diagnosis not present

## 2021-09-05 DIAGNOSIS — F32A Depression, unspecified: Secondary | ICD-10-CM | POA: Diagnosis not present

## 2021-09-05 DIAGNOSIS — I10 Essential (primary) hypertension: Secondary | ICD-10-CM

## 2021-09-05 DIAGNOSIS — E785 Hyperlipidemia, unspecified: Secondary | ICD-10-CM | POA: Diagnosis not present

## 2021-09-05 DIAGNOSIS — F419 Anxiety disorder, unspecified: Secondary | ICD-10-CM | POA: Diagnosis not present

## 2021-09-05 MED ORDER — BUSPIRONE HCL 5 MG PO TABS: 5.0000 mg | ORAL_TABLET | Freq: Two times a day (BID) | ORAL | 3 refills | Status: DC

## 2021-09-05 NOTE — Progress Notes (Signed)
? ?Provider: Richarda Bladeinah Meshilem Machuca FNP-Mcintosh  ? ?Bethany Mcintosh, Bethany Citrininah C, NP ? ?Patient Care Team: ?Bethany Mcintosh, Bethany Citrininah C, NP as PCP - General (Family Medicine) ?Bethany Mcintosh, Graham, MD as Consulting Physician (Ophthalmology) ? ?Extended Emergency Contact Information ?Primary Emergency Contact: Bethany Mcintosh ?Address: [address] ?         [place],  [id number] ?Home Phone: [phone] ?Relation: Son ?Secondary Emergency Contact: Bethany Mcintosh ? [place] ?Home Phone: [phone] ?Mobile Phone: [phone] ?Relation: Sister ? ?Code Status:  Full Code  ?Goals of care: Advanced Directive information ? ?  09/05/2021  ?  8:55 AM  ?Advanced Directives  ?Does Patient Have a Medical Advance Directive? Yes  ?Type of Estate agentAdvance Directive Healthcare Power of [place];Living will  ?Does patient want to make changes to medical advance directive? No - Patient declined  ?Copy of Healthcare Power of Attorney in Chart? No - copy requested  ? ? ? ?Chief Complaint  ?Patient presents with  ? Medical Management of Chronic Issues  ?  4 month follow up.   ? Health Maintenance  ?  [facility]  ? Immunizations  ?  Discuss the need for Tetanus vaccine.  ? ? ?HPI:  ?Pt is a 76 y.o. female seen today for 4 months follow-up for medical management of chronic diseases. She is here with her son who speaks fluent [place] and answers with translation.  She denies any acute issues during visit. Has some medical history of hypertension, depression and anxiety,insomnia among others. ?Depression symptoms have improved but continues to just stay anxious most of the time.  Appetite has been good ?Has gained 1 pound since last seen 4 months ago. ?Does walks three times per week for 30 minutes. ?No fall episodes or acute illness. ? ?Health maintenance: ?She is due for bone density but son [facility]. ?Prescription previously sent to pharmacy for tetanus vaccine did not go to get  the vaccine. ?States will reminded to get tetanus vaccine at her pharmacy will send another prescription. ? ?Past Medical History:  ?Diagnosis Date  ? H/O mammogram 2022  ? patient does Mammogram annually.  ? Hx of colonoscopy 2018  ? 2008 as well  ? Hypertension   ? ?Past Surgical History:  ?Procedure Laterality Date  ? CATARACT EXTRACTION Bilateral 2018  ? Bethany ContrasGraham Mcintosh  ? ? ?Allergies  ?Allergen Reactions  ? Sertraline Hcl Other (See Comments)  ? ? ?Allergies as of 09/05/2021   ? ?   Reactions  ? Sertraline Hcl Other (See Comments)  ? ?  ? ?  ?Medication List  ?  ? ?  ? Accurate as of Sep 05, 2021  9:32 AM. If you have any questions, ask your nurse or doctor.  ?  ?  ? ?  ? ?amLODipine 5 MG tablet ?Commonly known as: NORVASC ?Take 1 tablet by mouth daily. ?  ?DULoxetine 20 MG capsule ?Commonly known as: CYMBALTA ?Take 1 capsule by mouth once daily ?  ?OVER THE COUNTER MEDICATION ?Take 1 capsule by mouth daily. Calcium with Vitamin B12 ... 600mg .  ? ?Please confirm with patient at appointment if Calcium is 600mg  and Vitamin B12 MG? ?  ?Polyethyl Glycol-Propyl Glycol 0.4-0.3 % Soln ?Apply 1 drop to eye as needed. ?  ? ?  ? ? ?Review of Systems  ?Constitutional:  Negative for appetite change, chills, fatigue, fever and unexpected weight change.  ?HENT:  Negative for congestion, dental problem, ear discharge, ear  pain, facial swelling, hearing loss, nosebleeds, postnasal drip, rhinorrhea, sinus pressure, sinus pain, sneezing, sore throat, tinnitus and trouble swallowing.   ?Eyes:  Negative for pain, discharge, redness, itching and visual disturbance.  ?Respiratory:  Negative for cough, chest tightness, shortness of breath and wheezing.   ?Cardiovascular:  Negative for chest pain, palpitations and leg swelling.  ?Gastrointestinal:  Negative for abdominal distention, abdominal pain, blood in stool, constipation, diarrhea, nausea and vomiting.  ?Endocrine: Negative for cold intolerance, heat intolerance, polydipsia,  polyphagia and polyuria.  ?Genitourinary:  Negative for difficulty urinating, dysuria, flank pain, frequency and urgency.  ?Musculoskeletal:  Negative for arthralgias, back pain, gait problem, joint swelling, myalgias, neck pain and neck stiffness.  ?Skin:  Negative for color change, pallor, rash and wound.  ?Neurological:  Negative for dizziness, syncope, speech difficulty, weakness, light-headedness, numbness and headaches.  ?Hematological:  Does not bruise/bleed easily.  ?Psychiatric/Behavioral:  Negative for agitation, behavioral problems, confusion, hallucinations, self-injury, sleep disturbance and suicidal ideas. The patient is not nervous/anxious.   ? ?Immunization History  ?Administered Date(s) Administered  ? Influenza Split 04/03/2011, 04/12/2012, 01/02/2019, 01/24/2020  ? Influenza, High Dose Seasonal PF 01/10/2021  ? Influenza,inj,Quad PF,6+ Mos 01/31/2016, 02/26/2017, 02/11/2018  ? Influenza,inj,quad, With Preservative 02/16/2014, 03/08/2015  ? PFIZER(Purple Top)SARS-COV-2 Vaccination 05/19/2019, 06/09/2019, 03/04/2020, 11/13/2020  ? [employer], [employer] 47yrs & up 02/05/2021  ? Pneumococcal Conjugate-13 05/30/2014  ? Pneumococcal Polysaccharide-23 04/12/2012  ? Tdap 09/28/2008  ? Zoster Recombinat (Shingrix) 12/22/2018, 03/01/2019  ? Zoster, Live 04/28/2012  ? ?Pertinent  Health Maintenance Due  ?Topic Date Due  ? DEXA SCAN  Never done  ? INFLUENZA VACCINE  11/25/2021  ? MAMMOGRAM  06/28/2022  ? COLONOSCOPY (Pts 45-38yrs Insurance coverage will need to be confirmed)  12/15/2026  ? ? ?  04/01/2021  ? 10:22 AM 05/06/2021  ?  9:56 AM 09/05/2021  ?  8:55 AM  ?Fall Risk  ?Falls in the past year? 0 0 0  ?Was there an injury with Fall? 0 0 0  ?Fall Risk Category Calculator 0 0 0  ?Fall Risk Category Low Low Low  ?Patient Fall Risk Level Low fall risk Low fall risk Low fall risk  ?Patient at Risk for Falls Due to No Fall Risks No Fall Risks No Fall Risks  ?Fall risk Follow up Falls evaluation  completed Falls evaluation completed Falls evaluation completed  ? ?Functional Status Survey: ?  ? ?Vitals:  ? 09/05/21 0848  ?BP: 110/64  ?Pulse: 86  ?Resp: 16  ?Temp: 97.8 ?F (36.6 ?Mcintosh)  ?SpO2: 92%  ?Weight: 87 lb 12.8 oz (39.8 kg)  ?Height:  (1.575 m)  ? ?Body mass index is 16.06 kg/m?Marland Kitchen ?Physical Exam ?Vitals reviewed.  ?Constitutional:   ?   General: She is not in acute distress. ?   Appearance: Normal appearance. She is normal weight. She is not ill-appearing or diaphoretic.  ?HENT:  ?   Head: Normocephalic.  ?   Right Ear: Tympanic membrane, ear canal and external ear normal. There is no impacted cerumen.  ?   Left Ear: Tympanic membrane, ear canal and external ear normal. There is no impacted cerumen.  ?   Nose: Nose normal. No congestion or rhinorrhea.  ?   Mouth/Throat:  ?   Mouth: Mucous membranes are moist.  ?   Pharynx: Oropharynx is clear. No oropharyngeal exudate or posterior oropharyngeal erythema.  ?Eyes:  ?   General: No scleral icterus.    ?   Right eye: No discharge.     ?  Left eye: No discharge.  ?   Extraocular Movements: Extraocular movements intact.  ?   Conjunctiva/sclera: Conjunctivae normal.  ?   Pupils: Pupils are equal, round, and reactive to light.  ?Neck:  ?   Vascular: No carotid bruit.  ?Cardiovascular:  ?   Rate and Rhythm: Normal rate and regular rhythm.  ?   Pulses: Normal pulses.  ?   Heart sounds: Normal heart sounds. No murmur heard. ?  No friction rub. No gallop.  ?Pulmonary:  ?   Effort: Pulmonary effort is normal. No respiratory distress.  ?   Breath sounds: Normal breath sounds. No wheezing, rhonchi or rales.  ?Chest:  ?   Chest wall: No tenderness.  ?Abdominal:  ?   General: Bowel sounds are normal. There is no distension.  ?   Palpations: Abdomen is soft. There is no mass.  ?   Tenderness: There is no abdominal tenderness. There is no right CVA tenderness, left CVA tenderness, guarding or rebound.  ?Musculoskeletal:     ?   General: No swelling or tenderness. Normal  range of motion.  ?   Cervical back: Normal range of motion. No rigidity or tenderness.  ?   Right lower leg: No edema.  ?   Left lower leg: No edema.  ?Lymphadenopathy:  ?   Cervical: No cervical adenopath

## 2021-09-10 ENCOUNTER — Other Ambulatory Visit: Payer: Medicare Other

## 2021-09-10 DIAGNOSIS — I10 Essential (primary) hypertension: Secondary | ICD-10-CM | POA: Diagnosis not present

## 2021-09-10 DIAGNOSIS — E785 Hyperlipidemia, unspecified: Secondary | ICD-10-CM | POA: Diagnosis not present

## 2021-09-11 LAB — BASIC METABOLIC PANEL WITH GFR
BUN: 17 mg/dL (ref 7–25)
CO2: 29 mmol/L (ref 20–32)
Calcium: 9.2 mg/dL (ref 8.6–10.4)
Chloride: 105 mmol/L (ref 98–110)
Creat: 0.73 mg/dL (ref 0.60–1.00)
Glucose, Bld: 90 mg/dL (ref 65–99)
Potassium: 4.1 mmol/L (ref 3.5–5.3)
Sodium: 143 mmol/L (ref 135–146)
eGFR: 86 mL/min/{1.73_m2} (ref >=60)

## 2021-09-11 LAB — LIPID PANEL
Cholesterol: 167 mg/dL (ref <200)
HDL: 78 mg/dL (ref >=50)
LDL Cholesterol (Calc): 75 mg/dL (calc)
Non-HDL Cholesterol (Calc): 89 mg/dL (calc) (ref <130)
Total CHOL/HDL Ratio: 2.1 (calc) (ref <5.0)
Triglycerides: 61 mg/dL (ref <150)

## 2021-09-11 LAB — CBC WITH DIFFERENTIAL/PLATELET
Absolute Monocytes: 342 cells/uL (ref 200–950)
Basophils Absolute: 40 cells/uL (ref 0–200)
Basophils Relative: 1.9 %
Eosinophils Absolute: 40 cells/uL (ref 15–500)
Eosinophils Relative: 1.9 %
HCT: 40.8 % (ref 35.0–45.0)
Hemoglobin: 13.3 g/dL (ref 11.7–15.5)
Lymphs Abs: 1046 cells/uL (ref 850–3900)
MCH: 31.7 pg (ref 27.0–33.0)
MCHC: 32.6 g/dL (ref 32.0–36.0)
MCV: 97.1 fL (ref 80.0–100.0)
MPV: 10.5 fL (ref 7.5–12.5)
Monocytes Relative: 16.3 %
Neutro Abs: 632 cells/uL — ABNORMAL LOW (ref 1500–7800)
Neutrophils Relative %: 30.1 %
Platelets: 190 10*3/uL (ref 140–400)
RBC: 4.2 10*6/uL (ref 3.80–5.10)
RDW: 12 % (ref 11.0–15.0)
Total Lymphocyte: 49.8 %
WBC: 2.1 10*3/uL — ABNORMAL LOW (ref 3.8–10.8)

## 2021-10-02 ENCOUNTER — Ambulatory Visit
Admission: RE | Admit: 2021-10-02 | Discharge: 2021-10-02 | Disposition: A | Discharge status: Home / Self Care | Payer: Medicare Other | Source: Ambulatory Visit | Attending: Family | Admitting: Family

## 2021-10-02 DIAGNOSIS — Z780 Asymptomatic menopausal state: Secondary | ICD-10-CM

## 2021-10-02 DIAGNOSIS — M810 Age-related osteoporosis without current pathological fracture: Secondary | ICD-10-CM | POA: Diagnosis not present

## 2021-10-02 DIAGNOSIS — M8588 Other specified disorders of bone density and structure, other site: Secondary | ICD-10-CM | POA: Diagnosis not present

## 2021-10-07 ENCOUNTER — Encounter: Payer: Self-pay | Admitting: Family

## 2021-10-07 ENCOUNTER — Ambulatory Visit (INDEPENDENT_AMBULATORY_CARE_PROVIDER_SITE_OTHER): Payer: Medicare Other | Admitting: Family

## 2021-10-07 VITALS — BP 110/70 | HR 84 | Temp 97.7°F | Resp 16 | Ht 62.0 in | Wt 85.2 lb

## 2021-10-07 DIAGNOSIS — Z23 Encounter for immunization: Secondary | ICD-10-CM

## 2021-10-07 DIAGNOSIS — F419 Anxiety disorder, unspecified: Secondary | ICD-10-CM

## 2021-10-07 DIAGNOSIS — F32A Depression, unspecified: Secondary | ICD-10-CM | POA: Diagnosis not present

## 2021-10-07 DIAGNOSIS — E785 Hyperlipidemia, unspecified: Secondary | ICD-10-CM | POA: Diagnosis not present

## 2021-10-07 DIAGNOSIS — M810 Age-related osteoporosis without current pathological fracture: Secondary | ICD-10-CM | POA: Insufficient documentation

## 2021-10-07 DIAGNOSIS — I10 Essential (primary) hypertension: Secondary | ICD-10-CM

## 2021-10-07 MED ORDER — TETANUS-DIPHTH-ACELL PERTUSSIS 5-2.5-18.5 LF-MCG/0.5 IM SUSP: 0.5000 mL | Freq: Once | INTRAMUSCULAR | 0 refills | Status: AC

## 2021-10-07 MED ORDER — BUSPIRONE HCL 5 MG PO TABS: 5.0000 mg | ORAL_TABLET | Freq: Every day | ORAL | 1 refills | Status: DC

## 2021-10-07 MED ORDER — AMLODIPINE BESYLATE 5 MG PO TABS: 5.0000 mg | ORAL_TABLET | Freq: Every day | ORAL | 1 refills | Status: DC

## 2021-10-07 NOTE — Assessment & Plan Note (Signed)
Mood stable -Continue on buspirone and duloxetine -We will continue to monitor mood

## 2021-10-07 NOTE — Progress Notes (Signed)
Provider: Marlowe Sax FNP-C  Bethany Mcintosh, Bethany Bucks, NP  Patient Care Team: Embree Brawley, Bethany Bucks, NP as PCP - General (Family Medicine) Katy Apo, MD as Consulting Physician (Ophthalmology)  Extended Emergency Contact Information Primary Emergency Contact: Bethany Mcintosh,Bethany Mcintosh Address: [address] [place],  [address] Home Phone: [phone] Relation: Son Secondary Emergency Contact: Bethany Mcintosh Phone: [phone] Mobile Phone: [phone] Relation: Sister  Code Status:  Full Code  Goals of care: Advanced Directive information    10/07/2021   10:05 AM  Advanced Directives  Does Patient Have a Medical Advance Directive? Yes  Type of Paramedic of [place];Living will  Does patient want to make changes to medical advance directive? No - Patient declined  Copy of [place] in Chart? Yes - validated most recent copy scanned in chart (See row information)     Chief Complaint  Patient presents with   Medical Management of Chronic Issues    1 month follow up for anxiety. Patient states that Buspar 66m twice daily makes her sleepy. Patient would like to take medication once daily if possible.     HPI:  Pt is a 76y.o. female seen today for an acute visit for one month follow up for anxiety.she was prescribed buspar but states twice daily makes her sleepy would prefer once a day. Has been taking BuSpar once daily which seems to be helping with her symptoms. [facility]. Her Recent lab results reviewed and discussed with patient.  Labs unremarkable except white count still slightly low was advised to take vitamin C 500 mg daily. Bone density done 10/02/2021 indicated right total femur T score -2.5 indicating osteoporosis, treatment discussed with patient during visit.  States took Fosamax for over 10 years but was told by previous provider that she  should not have taken it more than 5 years since it can weaken the bones.  So she discontinued her Fosamax.  Patient declines to restart Fosamax.  Injectables also discussed but patient declined.  She would like to continue with vitamin D and calcium.   Past Medical History:  Diagnosis Date   H/O mammogram 2022   patient does Mammogram annually.   Hx of colonoscopy 2018   2008 as well   Hypertension    Past Surgical History:  Procedure Laterality Date   CATARACT EXTRACTION Bilateral 2018   GKaty Apo   Allergies  Allergen Reactions   Sertraline Hcl Other (See Comments)    Outpatient Encounter Medications as of 10/07/2021  Medication Sig   amLODipine (NORVASC) 5 MG tablet Take 1 tablet by mouth daily.   busPIRone (BUSPAR) 5 MG tablet Take 1 tablet (5 mg total) by mouth 2 (two) times daily.   DULoxetine (CYMBALTA) 20 MG capsule Take 1 capsule by mouth once daily   OVER THE COUNTER MEDICATION Take 1 capsule by mouth daily. Calcium with Vitamin B12 ... 60101m   Please confirm with patient at appointment if Calcium is 60048mnd Vitamin B12 MG?   Polyethyl Glycol-Propyl Glycol 0.4-0.3 % SOLN Apply 1 drop to eye as needed.   No facility-administered encounter medications on file as of 10/07/2021.    Review of Systems  Constitutional:  Negative for appetite change, chills, fatigue, fever and unexpected weight change.  HENT:  Negative for congestion, dental problem, ear discharge, ear pain, facial swelling,  hearing loss, nosebleeds, postnasal drip, rhinorrhea, sinus pressure, sinus pain, sneezing, sore throat, tinnitus and trouble swallowing.   Eyes:  Negative for pain, discharge, redness, itching and visual disturbance.  Respiratory:  Negative for cough, chest tightness, shortness of breath and wheezing.   Cardiovascular:  Negative for chest pain, palpitations and leg swelling.  Gastrointestinal:  Negative for abdominal distention, abdominal pain, blood in stool, constipation,  diarrhea, nausea and vomiting.  Endocrine: Negative for cold intolerance, heat intolerance, polydipsia, polyphagia and polyuria.  Genitourinary:  Negative for difficulty urinating, dysuria, flank pain, frequency and urgency.  Musculoskeletal:  Negative for arthralgias, back pain, gait problem, joint swelling, myalgias, neck pain and neck stiffness.  Skin:  Negative for color change, pallor, rash and wound.  Neurological:  Negative for dizziness, syncope, speech difficulty, weakness, light-headedness, numbness and headaches.  Hematological:  Does not bruise/bleed easily.  Psychiatric/Behavioral:  Negative for agitation, behavioral problems, confusion, hallucinations, self-injury, sleep disturbance and suicidal ideas. The patient is not nervous/anxious.     Immunization History  Administered Date(s) Administered   Influenza Split 04/03/2011, 04/12/2012, 01/02/2019, 01/24/2020   Influenza, High Dose Seasonal PF 01/10/2021   Influenza,inj,Quad PF,6+ Mos 01/31/2016, 02/26/2017, 02/11/2018   Influenza,inj,quad, With Preservative 02/16/2014, 03/08/2015   PFIZER(Purple Top)SARS-COV-2 Vaccination 05/19/2019, 06/09/2019, 03/04/2020, 11/13/2020   Pfizer Covid-19 Vaccine Bivalent Booster 28yr & up 02/05/2021   Pneumococcal Conjugate-13 05/30/2014   Pneumococcal Polysaccharide-23 04/12/2012   Tdap 09/28/2008   Zoster Recombinat (Shingrix) 12/22/2018, 03/01/2019   Zoster, Live 04/28/2012   Pertinent  Health Maintenance Due  Topic Date Due   INFLUENZA VACCINE  11/25/2021   MAMMOGRAM  06/28/2022   COLONOSCOPY (Pts [id number] coverage will need to be confirmed)  12/15/2026   DEXA SCAN  Completed      04/01/2021   10:22 AM 05/06/2021    9:56 AM 09/05/2021    8:55 AM 10/07/2021   10:05 AM  Fall Risk  Falls in the past year? 0 0 0 0  Was there an injury with Fall? 0 0 0 0  Fall Risk Category Calculator 0 0 0 0  Fall Risk Category Low Low Low Low  Patient Fall Risk Level Low fall risk Low  fall risk Low fall risk Low fall risk  Patient at Risk for Falls Due to No Fall Risks No Fall Risks No Fall Risks No Fall Risks  Fall risk Follow up Falls evaluation completed Falls evaluation completed Falls evaluation completed Falls evaluation completed   Functional Status Survey:    Vitals:   10/07/21 0951  BP: 110/70  Pulse: 84  Resp: 16  Temp: 97.7 F (36.5 C)  SpO2: 99%  Weight: 85 lb 3.2 oz (38.6 kg)  Height: '5\' 2"'  (1.575 m)   Body mass index is 15.58 kg/m. Physical Exam Vitals reviewed.  Constitutional:      General: She is not in acute distress.    Appearance: Normal appearance. She is underweight. She is not ill-appearing or diaphoretic.  HENT:     Head: Normocephalic.     Right Ear: Tympanic membrane, ear canal and external ear normal. There is no impacted cerumen.     Left Ear: Tympanic membrane, ear canal and external ear normal. There is no impacted cerumen.     Nose: Nose normal. No congestion or rhinorrhea.     Mouth/Throat:     Mouth: Mucous membranes are moist.     Pharynx: Oropharynx is clear. No oropharyngeal exudate or posterior oropharyngeal erythema.  Eyes:     General:  No scleral icterus.       Right eye: No discharge.        Left eye: No discharge.     Extraocular Movements: Extraocular movements intact.     Conjunctiva/sclera: Conjunctivae normal.     Pupils: Pupils are equal, round, and reactive to light.  Neck:     Vascular: No carotid bruit.  Cardiovascular:     Rate and Rhythm: Normal rate and regular rhythm.     Pulses: Normal pulses.     Heart sounds: Normal heart sounds. No murmur heard.    No friction rub. No gallop.  Pulmonary:     Effort: Pulmonary effort is normal. No respiratory distress.     Breath sounds: Normal breath sounds. No wheezing, rhonchi or rales.  Chest:     Chest wall: No tenderness.  Abdominal:     General: Bowel sounds are normal. There is no distension.     Palpations: Abdomen is soft. There is no mass.      Tenderness: There is no abdominal tenderness. There is no right CVA tenderness, left CVA tenderness, guarding or rebound.  Musculoskeletal:        General: No swelling or tenderness. Normal range of motion.     Cervical back: Normal range of motion. No rigidity or tenderness.     Right lower leg: No edema.     Left lower leg: No edema.  Lymphadenopathy:     Cervical: No cervical adenopathy.  Skin:    General: Skin is warm and dry.     Coloration: Skin is not pale.     Findings: No bruising, erythema, lesion or rash.  Neurological:     Mental Status: She is alert and oriented to person, place, and time.     Cranial Nerves: No cranial nerve deficit.     Sensory: No sensory deficit.     Motor: No weakness.     Coordination: Coordination normal.     Gait: Gait normal.  Psychiatric:        Mood and Affect: Mood normal.        Speech: Speech normal.        Behavior: Behavior normal.        Thought Content: Thought content normal.        Judgment: Judgment normal.     Labs reviewed: Recent Labs    05/02/21 0820 09/10/21 0903  NA 141 143  K 3.9 4.1  CL 103 105  CO2 29 29  GLUCOSE 86 90  BUN 11 17  CREATININE 0.66 0.73  CALCIUM 9.9 9.2   Recent Labs    05/02/21 0820  AST 23  ALT 21  BILITOT 0.7  PROT 8.1   Recent Labs    05/02/21 0820 09/10/21 0903  WBC 2.5* 2.1*  NEUTROABS 1,180* 632*  HGB 14.1 13.3  HCT 42.4 40.8  MCV 94.4 97.1  PLT 210 190   Lab Results  Component Value Date   TSH 2.06 05/02/2021   No results found for: "HGBA1C" Lab Results  Component Value Date   CHOL 167 09/10/2021   HDL 78 09/10/2021   LDLCALC 75 09/10/2021   TRIG 61 09/10/2021   CHOLHDL 2.1 09/10/2021    Significant Diagnostic Results in last 30 days:  DG Bone Density  Result Date: 10/02/2021 EXAM: DUAL X-RAY ABSORPTIOMETRY (DXA) FOR BONE MINERAL DENSITY IMPRESSION: Referring Physician:  Nelda Mcintosh [place] Your patient completed a bone mineral density test using GE Lunar iDXA  system (analysis version:  16). Technologist: EDH PATIENT: Name: Bethany Mcintosh, Bethany Mcintosh Patient ID: [id number] Birth Date: 16-May-1945 Height: 61.5 in. Sex: Female Measured: 10/02/2021 Weight: 83.4 lbs. Indications: Cymbalta, Estrogen Deficient, Postmenopausal Fractures: NONE Treatments: Calcium (E943.0), Vitamin D (E933.5) ASSESSMENT: The BMD measured at Femur Total Right is 0.688 g/cm2 with a T-score of -2.5. This patient is considered osteoporotic according to [facility]) criteria. The quality of the exam is good. Site Region Measured Date Measured Age YA BMD Significant CHANGE T-score AP Spine  L1-L4       11/02/20[age]         -2.1    0.927 g/cm2 DualFemur Total Right 10/02/2022    74.7         -2.5    0.688 g/cm2 DualFemur Total Mean  10[age]02/2022    74.7         -2.5    0.690 g/cm2 World Health Organization [facility]) criteria for post-menopausal, Caucasian Women: Normal       T-score at or above -1 SD Osteopenia   T-score between -1 and -2.5 SD Osteoporosis T-score at or below -2.5 SD RECOMMENDATION: 1. All patients should optimize calcium and vitamin D intake. 2. Consider FDA-approved medical therapies in postmenopausal women and men aged 91 years and older, based on the following: a. A hip or vertebral (clinical or morphometric) fracture. b. T-score = -2.5 at the femoral neck or spine after appropriate evaluation to exclude secondary causes. c. Low bone mass (T-score between -1.0 and -2.5 at the femoral neck or spine) and a 10-year probability of a hip fracture = 3% or a 10-year probability of a major osteoporosis-related fracture = 20% based on the [place]-adapted WHO algorithm. d. Clinician judgment and/or patient preferences may indicate treatment for people with 10-year fracture probabilities above or below these levels. FOLLOW-UP: Patients with diagnosis of osteoporosis or at high risk for fracture should have regular bone mineral density tests.? Patients eligible for Medicare are allowed routine testing  every 2 years.? The testing frequency can be increased to one year for patients who have rapidly progressing disease, are receiving or discontinuing medical therapy to restore bone mass, or have additional risk factors. I have reviewed this study and agree with the findings. [facility] Electronically Signed   By: Ammie Ferrier M.D.   On: 10/02/2021 13:32    Assessment/Plan Problem List Items Addressed This Visit       Cardiovascular and Mediastinum   Essential hypertension, benign - Primary    - Blood pressure well controlled -Continue on amlodipine      Relevant Medications   amLODipine (NORVASC) 5 MG tablet   Other Relevant Orders   COMPLETE METABOLIC PANEL WITH GFR   CBC with Differential/Platelet     Musculoskeletal and Integument   Age-related osteoporosis without current pathological fracture    Bone density done 10/02/2021 indicated right total femur T score -2.5 indicating osteoporosis, treatment discussed with patient during visit. She took Fosamax for over 10 years but was told by previous provider that she should not have taken it more than 5 years since it can weaken the bones.  So she discontinued her Fosamax. -Injectables offered but declined -Advised to continue with calcium and vitamin D supplements and weightbearing exercises        Other   Need for Tdap vaccination    Advised to get Tdap vaccine at the pharmacy. Script send to pharmacy today       Relevant Medications   Tdap (  BOOSTRIX) 5-2.5-18.5 LF-MCG/0.5 injection   Hyperlipidemia LDL goal <100    LDL at goal -Continue dietary modification and exercise      Relevant Medications   amLODipine (NORVASC) 5 MG tablet   Other Relevant Orders   Lipid panel   Anxiety and depression    Mood stable -Continue on buspirone and duloxetine -We will continue to monitor mood      Relevant Medications   busPIRone (BUSPAR) 5 MG tablet    Family/ staff Communication: Reviewed plan of care with  patient verbalized understanding  Labs/tests ordered:  - CBC with Differential/Platelet - CMP with eGFR([facility]) - Lipid panel  Next Appointment: Return in about 6 months (around 04/08/2022), or if symptoms worsen or fail to improve, for medical mangement of chronic issues., Fasting labs in 6 months prior to visit.   Sandrea Hughs, NP

## 2021-10-07 NOTE — Assessment & Plan Note (Signed)
LDL at goal -Continue dietary modification and exercise

## 2021-10-07 NOTE — Assessment & Plan Note (Signed)
Blood pressure well controlled Continue on amlodipine 

## 2021-10-07 NOTE — Assessment & Plan Note (Signed)
Advised to get Tdap vaccine at the pharmacy. Script send to pharmacy today  

## 2021-10-07 NOTE — Assessment & Plan Note (Signed)
Bone density done 10/02/2021 indicated right total femur T score -2.5 indicating osteoporosis, treatment discussed with patient during visit. She took Fosamax for over 10 years but was told by previous provider that she should not have taken it more than 5 years since it can weaken the bones.  So she discontinued her Fosamax. -Injectables offered but declined -Advised to continue with calcium and vitamin D supplements and weightbearing exercises

## 2021-10-07 NOTE — Patient Instructions (Signed)
Please get your tetanus vaccine at the pharmacy.

## 2021-10-23 ENCOUNTER — Other Ambulatory Visit: Payer: Self-pay | Admitting: Family

## 2021-10-23 DIAGNOSIS — F419 Anxiety disorder, unspecified: Secondary | ICD-10-CM

## 2022-01-12 DIAGNOSIS — Z961 Presence of intraocular lens: Secondary | ICD-10-CM | POA: Diagnosis not present

## 2022-01-12 DIAGNOSIS — H524 Presbyopia: Secondary | ICD-10-CM | POA: Diagnosis not present

## 2022-01-12 DIAGNOSIS — H26493 Other secondary cataract, bilateral: Secondary | ICD-10-CM | POA: Diagnosis not present

## 2022-01-28 ENCOUNTER — Other Ambulatory Visit: Payer: Self-pay | Admitting: Family

## 2022-01-28 DIAGNOSIS — I10 Essential (primary) hypertension: Secondary | ICD-10-CM

## 2022-01-28 DIAGNOSIS — Z23 Encounter for immunization: Secondary | ICD-10-CM | POA: Diagnosis not present

## 2022-02-12 DIAGNOSIS — H26491 Other secondary cataract, right eye: Secondary | ICD-10-CM | POA: Diagnosis not present

## 2022-03-05 DIAGNOSIS — H26492 Other secondary cataract, left eye: Secondary | ICD-10-CM | POA: Diagnosis not present

## 2022-03-09 ENCOUNTER — Encounter: Payer: Self-pay | Admitting: Family

## 2022-03-09 ENCOUNTER — Ambulatory Visit (INDEPENDENT_AMBULATORY_CARE_PROVIDER_SITE_OTHER): Payer: Medicare Other | Admitting: Family

## 2022-03-09 VITALS — BP 110/64 | HR 80 | Temp 97.7°F | Resp 16 | Ht 62.0 in | Wt 85.2 lb

## 2022-03-09 DIAGNOSIS — Z23 Encounter for immunization: Secondary | ICD-10-CM

## 2022-03-09 DIAGNOSIS — H6122 Impacted cerumen, left ear: Secondary | ICD-10-CM | POA: Diagnosis not present

## 2022-03-09 DIAGNOSIS — R636 Underweight: Secondary | ICD-10-CM

## 2022-03-09 DIAGNOSIS — F32A Depression, unspecified: Secondary | ICD-10-CM

## 2022-03-09 DIAGNOSIS — I10 Essential (primary) hypertension: Secondary | ICD-10-CM | POA: Diagnosis not present

## 2022-03-09 DIAGNOSIS — G3184 Mild cognitive impairment, so stated: Secondary | ICD-10-CM | POA: Diagnosis not present

## 2022-03-09 DIAGNOSIS — E785 Hyperlipidemia, unspecified: Secondary | ICD-10-CM

## 2022-03-09 DIAGNOSIS — F419 Anxiety disorder, unspecified: Secondary | ICD-10-CM

## 2022-03-09 MED ORDER — TETANUS-DIPHTH-ACELL PERTUSSIS 5-2.5-18.5 LF-MCG/0.5 IM SUSP: 0.5000 mL | Freq: Once | INTRAMUSCULAR | 0 refills | Status: AC

## 2022-03-09 MED ORDER — DEBROX 6.5 % OT SOLN: 5.0000 [drp] | Freq: Two times a day (BID) | OTIC | 0 refills | Status: DC

## 2022-03-09 NOTE — Patient Instructions (Signed)
-   Instill debrox 6.5 otic solution 5 drops into left  ear twice daily x 4 days then follow up for ear lavage.May apply cotton ball at bedtime to prevent drainage to pillow.  

## 2022-03-09 NOTE — Progress Notes (Signed)
Provider: Richarda Blade FNP-C   Nazanin Kinner, [name], NP  Patient Care Team: Melodee Lupe, [name], NP as PCP - General (Family Medicine) Antony Contras, MD as Consulting Physician (Ophthalmology)  Extended Emergency Contact Information Primary Emergency Contact: [name],[name] Address: [address] [place]  [address] Home Phone: [phone] Relation: Son Secondary Emergency Contact: [name] of [place] Home Phone: [phone] Mobile Phone: [phone] Relation: Sister  Code Status:  Full Code  Goals of care: Advanced Directive information    03/09/2022   10:25 AM  Advanced Directives  Does Patient Have a Medical Advance Directive? Yes  Type of Advance Directive Healthcare Power of Attorney  Does patient want to make changes to medical advance directive? No - Patient declined     Chief Complaint  Patient presents with   Medical Management of Chronic Issues    Patient is here for a follow up for chronic conditions Discuss changes in dementia   Quality Metric Gaps    Patient is due for tdap, covid booster, and AWV    HPI:  Pt is a 76 y.o. female seen today for 6 months for medical management of chronic diseases.   She is here with the son who provides additional medical history.States patient has been more forgetful and worries a lot.sometimes when she gets a medical bill she goes over several times and ask son several times about the bill despite being told she does not own anything.  She states sometimes goes to the room to get something but forgets why she went to the room then later remembers.  She continues to perform own activities of daily living except son has had to assist with the bills. No agitation reported.   Weight stable this visit previous wt 85 lbs and 85.2 lbs today.states has been drinking half bottle of Ensure daily.Tends to feel full. Discussed drinking half in the morning and the rest either during lunch or evening.    No fall episode since last visit.   Due for Tetanus vaccine and COVID -19 booster.Made aware to get vaccine at the pharmacy.    Past Medical History:  Diagnosis Date   H/O mammogram 2022   patient does Mammogram annually.   Hx of colonoscopy 2018   2008 as well   Hypertension    Past Surgical History:  Procedure Laterality Date   CATARACT EXTRACTION Bilateral 2018   Antony Contras    Allergies  Allergen Reactions   Sertraline Hcl Other (See Comments)    Allergies as of 03/09/2022       Reactions   Sertraline Hcl Other (See Comments)        Medication List        Accurate as of March 09, 2022 11:07 AM. If you have any questions, ask your nurse or doctor.          amLODipine 5 MG tablet Commonly known as: NORVASC Take 1 tablet by mouth once daily   busPIRone 5 MG tablet Commonly known as: BUSPAR Take 1 tablet (5 mg total) by mouth daily.   DULoxetine 20 MG capsule Commonly known as: CYMBALTA Take 1 capsule by mouth once daily   OVER THE COUNTER MEDICATION Take 1 capsule by mouth daily. Calcium with Vitamin B12 ... 600mg .   Please confirm with patient at appointment if Calcium is 600mg  and Vitamin B12 MG?   Polyethyl Glycol-Propyl Glycol 0.4-0.3 % Soln Apply 1 drop to eye as  needed.        Review of Systems  Constitutional:  Negative for appetite change, chills, fatigue, fever and unexpected weight change.  HENT:  Negative for congestion, dental problem, ear discharge, ear pain, facial swelling, hearing loss, nosebleeds, postnasal drip, rhinorrhea, sinus pressure, sinus pain, sneezing, sore throat, tinnitus and trouble swallowing.   Eyes:  Negative for pain, discharge, redness, itching and visual disturbance.  Respiratory:  Negative for cough, chest tightness, shortness of breath and wheezing.   Cardiovascular:  Negative for chest pain, palpitations and leg swelling.  Gastrointestinal:  Negative for abdominal distention, abdominal pain, blood  in stool, constipation, diarrhea, nausea and vomiting.  Endocrine: Negative for cold intolerance, heat intolerance, polydipsia, polyphagia and polyuria.  Genitourinary:  Negative for difficulty urinating, dysuria, flank pain, frequency and urgency.  Musculoskeletal:  Negative for arthralgias, back pain, gait problem, joint swelling, myalgias, neck pain and neck stiffness.  Skin:  Negative for color change, pallor, rash and wound.  Neurological:  Negative for dizziness, syncope, speech difficulty, weakness, light-headedness, numbness and headaches.  Hematological:  Does not bruise/bleed easily.  Psychiatric/Behavioral:  Negative for agitation, behavioral problems, confusion, hallucinations, self-injury, sleep disturbance and suicidal ideas. The patient is not nervous/anxious.        Memory loss     Immunization History  Administered Date(s) Administered   Influenza Split 04/03/2011, 04/12/2012, 01/02/2019, 01/24/2020   Influenza, High Dose Seasonal PF 01/10/2021, 01/28/2022   Influenza,inj,Quad PF,6+ Mos 01/31/2016, 02/26/2017, 02/11/2018   Influenza,inj,quad, With Preservative 02/16/2014, 03/08/2015   PFIZER(Purple Top)SARS-COV-2 Vaccination 05/19/2019, 06/09/2019, 03/04/2020, 11/13/2020   Pfizer Covid-19 Vaccine Bivalent Booster 83yrs & up 02/05/2021   Pneumococcal Conjugate-13 05/30/2014   Pneumococcal Polysaccharide-23 04/12/2012   Respiratory Syncytial Virus Vaccine,Recomb Aduvanted(Arexvy) 01/28/2022   Tdap 09/28/2008   Zoster Recombinat (Shingrix) 12/22/2018, 03/01/2019   Zoster, Live 04/28/2012   Pertinent  Health Maintenance Due  Topic Date Due   COLONOSCOPY (Pts 45-15yrs Insurance coverage will need to be confirmed)  12/15/2026   INFLUENZA VACCINE  Completed   DEXA SCAN  Completed      04/01/2021   10:22 AM 05/06/2021    9:56 AM 09/05/2021    8:55 AM 10/07/2021   10:05 AM 03/09/2022   10:25 AM  Fall Risk  Falls in the past year? 0 0 0 0 0  Was there an injury with Fall?  0 0 0 0 0  Fall Risk Category Calculator 0 0 0 0 0  Fall Risk Category Low Low Low Low Low  Patient Fall Risk Level Low fall risk Low fall risk Low fall risk Low fall risk Low fall risk  Patient at Risk for Falls Due to No Fall Risks No Fall Risks No Fall Risks No Fall Risks No Fall Risks  Fall risk Follow up Falls evaluation completed Falls evaluation completed Falls evaluation completed Falls evaluation completed Falls evaluation completed   Functional Status Survey:    Vitals:   03/09/22 1053  BP: 110/64  Pulse: 80  Resp: 16  Temp: 97.7 F (36.5 C)  SpO2: 97%  Weight: 85 lb 3.2 oz (38.6 kg)  Height: 5\' 2"  (1.575 m)   Body mass index is 15.58 kg/m. Physical Exam Vitals reviewed.  Constitutional:      General: She is not in acute distress.    Appearance: Normal appearance. She is normal weight. She is not ill-appearing or diaphoretic.  HENT:     Head: Normocephalic.     Right Ear: Tympanic membrane, ear canal and external ear normal.  There is no impacted cerumen.     Left Ear: Tympanic membrane, ear canal and external ear normal. There is no impacted cerumen.     Nose: Nose normal. No congestion or rhinorrhea.     Mouth/Throat:     Mouth: Mucous membranes are moist.     Pharynx: Oropharynx is clear. No oropharyngeal exudate or posterior oropharyngeal erythema.  Eyes:     General: No scleral icterus.       Right eye: No discharge.        Left eye: No discharge.     Extraocular Movements: Extraocular movements intact.     Conjunctiva/sclera: Conjunctivae normal.     Pupils: Pupils are equal, round, and reactive to light.  Neck:     Vascular: No carotid bruit.  Cardiovascular:     Rate and Rhythm: Normal rate and regular rhythm.     Pulses: Normal pulses.     Heart sounds: Normal heart sounds. No murmur heard.    No friction rub. No gallop.  Pulmonary:     Effort: Pulmonary effort is normal. No respiratory distress.     Breath sounds: Normal breath sounds. No  wheezing, rhonchi or rales.  Chest:     Chest wall: No tenderness.  Abdominal:     General: Bowel sounds are normal. There is no distension.     Palpations: Abdomen is soft. There is no mass.     Tenderness: There is no abdominal tenderness. There is no right CVA tenderness, left CVA tenderness, guarding or rebound.  Musculoskeletal:        General: No swelling or tenderness. Normal range of motion.     Cervical back: Normal range of motion. No rigidity or tenderness.     Right lower leg: No edema.     Left lower leg: No edema.  Lymphadenopathy:     Cervical: No cervical adenopathy.  Skin:    General: Skin is warm and dry.     Coloration: Skin is not pale.     Findings: No bruising, erythema, lesion or rash.  Neurological:     Mental Status: She is alert and oriented to person, place, and time.     Cranial Nerves: No cranial nerve deficit.     Sensory: No sensory deficit.     Motor: No weakness.     Coordination: Coordination normal.     Gait: Gait normal.  Psychiatric:        Speech: Speech normal.        Behavior: Behavior normal.        Thought Content: Thought content normal.        Judgment: Judgment normal.     Comments: Memory loss scored 19/30 on MMSE     Labs reviewed: Recent Labs    05/02/21 0820 09/10/21 0903  NA 141 143  K 3.9 4.1  CL 103 105  CO2 29 29  GLUCOSE 86 90  BUN 11 17  CREATININE 0.66 0.73  CALCIUM 9.9 9.2   Recent Labs    05/02/21 0820  AST 23  ALT 21  BILITOT 0.7  PROT 8.1   Recent Labs    05/02/21 0820 09/10/21 0903  WBC 2.5* 2.1*  NEUTROABS 1,180* 632*  HGB 14.1 13.3  HCT 42.4 40.8  MCV 94.4 97.1  PLT 210 190   Lab Results  Component Value Date   TSH 2.06 05/02/2021   No results found for: "HGBA1C" Lab Results  Component Value Date   CHOL 167 09/10/2021  HDL 78 09/10/2021   LDLCALC 75 09/10/2021   TRIG 61 09/10/2021   CHOLHDL 2.1 09/10/2021    Significant Diagnostic Results in last 30 days:  No results  found.  Assessment/Plan 1. Essential hypertension, benign Blood pressure controlled -Continue on amlodipine 5 mg tablet- Continue to monitor blood pressure  2. Hyperlipidemia LDL goal <100 LDL at goal Continue with heart healthy diet 3. Anxiety and depression -She stopped taking buspirone due to reports of some abdominal comfort while on medication. -Symptoms which increased anxiety.  Declined any medication adjustment or change of medication. Continue on duloxetine 20 mg daily Continue on Cymbalta  4. Underweight BMI 15.58 Weight stable this visit Encouraged to drink Ensure 1 by mouth daily.  Recommended to split into 2 if unable to drink at once. -Continue to monitor weight  5. Mild cognitive impairment Scored 19 out of 30 on MMSE We will obtain lab work to rule out any other acute or metabolic issues. - Ambulatory referral to Neuropsychology - Vitamin B12; Future  6. Need for Tdap vaccination Advised to get Tdap vaccine at the pharmacy. Script send to pharmacy today -Advised to get tetanus vaccine at the pharmacy - Tdap (BOOSTRIX) 5-2.5-18.5 LF-MCG/0.5 injection; Inject 0.5 mLs into the muscle once for 1 dose.  Dispense: 0.5 mL; Refill: 0  7. Impacted cerumen of left ear TM not visualized due to cerumen impaction Advised to use Debrox as below then follow-up for left ear lavage. - carbamide peroxide (DEBROX) 6.5 % OTIC solution; Place 5 drops into the left ear 2 (two) times daily.  Dispense: 15 mL; Refill: 0  Family/ staff Communication: Reviewed plan of care with patient and son verbalized understanding.   Labs/tests ordered: Has labs orders in place  Next Appointment : Return in about 6 months (around 09/07/2022) for fasting labs in one week or sooner., medical mangement of chronic issues.Caesar Bookman, NP

## 2022-03-10 DIAGNOSIS — Z23 Encounter for immunization: Secondary | ICD-10-CM | POA: Diagnosis not present

## 2022-03-16 ENCOUNTER — Ambulatory Visit (INDEPENDENT_AMBULATORY_CARE_PROVIDER_SITE_OTHER): Payer: Medicare Other | Admitting: Family

## 2022-03-16 ENCOUNTER — Encounter: Payer: Self-pay | Admitting: Family

## 2022-03-16 ENCOUNTER — Other Ambulatory Visit: Payer: Medicare Other

## 2022-03-16 VITALS — BP 110/70 | HR 97 | Temp 98.3°F | Resp 16 | Ht 62.0 in | Wt 84.0 lb

## 2022-03-16 DIAGNOSIS — I10 Essential (primary) hypertension: Secondary | ICD-10-CM | POA: Diagnosis not present

## 2022-03-16 DIAGNOSIS — H6122 Impacted cerumen, left ear: Secondary | ICD-10-CM

## 2022-03-16 DIAGNOSIS — G3184 Mild cognitive impairment, so stated: Secondary | ICD-10-CM | POA: Diagnosis not present

## 2022-03-16 DIAGNOSIS — E785 Hyperlipidemia, unspecified: Secondary | ICD-10-CM | POA: Diagnosis not present

## 2022-03-16 NOTE — Progress Notes (Signed)
Provider: Richarda Blade FNP-C  Sharonna Vinje, [name], NP  Patient Care Team: Metta Koranda, [name], NP as PCP - General (Family Medicine) Antony Contras, MD as Consulting Physician (Ophthalmology)  Extended Emergency Contact Information Primary Emergency Contact: [name],[name] Address: [address] [place]  [address] Home Phone: [phone] Relation: Son Secondary Emergency Contact: [name] of [place] Home Phone: [phone] Mobile Phone: [phone] Relation: Sister  Code Status:  Full Code  Goals of care: Advanced Directive information    03/16/2022    8:26 AM  Advanced Directives  Does Patient Have a Medical Advance Directive? Yes  Type of Advance Directive Healthcare Power of Attorney  Does patient want to make changes to medical advance directive? No - Patient declined  Copy of Healthcare Power of Attorney in Chart? Yes - validated most recent copy scanned in chart (See row information)     Chief Complaint  Patient presents with   Follow-up    Left ear lavage.     HPI:  Pt is a 76 y.o. female seen today for an acute visit for evaluation of left ear lavage.was here for cerumen impaction was noted. She was here 03/09/2022 left cerumen impaction noted was advised to instil debrox 6.5 % otic solution 5 drops into left ear twice daily x 4 days then follow up today for ear lavage. She denies any pain,tinnitus,change of hearing on left ear.  Also here for fasting lab work.   Past Medical History:  Diagnosis Date   H/O mammogram 2022   patient does Mammogram annually.   Hx of colonoscopy 2018   2008 as well   Hypertension    Past Surgical History:  Procedure Laterality Date   CATARACT EXTRACTION Bilateral 2018   Antony Contras    Allergies  Allergen Reactions   Sertraline Hcl Other (See Comments)    Outpatient Encounter Medications as of 03/16/2022  Medication Sig   amLODipine (NORVASC) 5 MG tablet Take 1 tablet by mouth once  daily   carbamide peroxide (DEBROX) 6.5 % OTIC solution Place 5 drops into the left ear 2 (two) times daily.   DULoxetine (CYMBALTA) 20 MG capsule Take 1 capsule by mouth once daily   OVER THE COUNTER MEDICATION Take 1 capsule by mouth daily. Calcium with Vitamin B12 ... 600mg .   Please confirm with patient at appointment if Calcium is 600mg  and Vitamin B12 MG?   Polyethyl Glycol-Propyl Glycol 0.4-0.3 % SOLN Apply 1 drop to eye as needed.   No facility-administered encounter medications on file as of 03/16/2022.    Review of Systems  Constitutional:  Negative for appetite change, chills, fatigue, fever and unexpected weight change.  HENT:  Negative for congestion, dental problem, ear discharge, ear pain, facial swelling, hearing loss, nosebleeds, postnasal drip, rhinorrhea, sinus pressure, sinus pain, sneezing, sore throat, tinnitus and trouble swallowing.   Eyes:  Negative for pain, discharge, redness, itching and visual disturbance.  Respiratory:  Negative for cough, chest tightness, shortness of breath and wheezing.   Cardiovascular:  Negative for chest pain, palpitations and leg swelling.  Skin:  Negative for color change, pallor and rash.  Neurological:  Negative for dizziness, light-headedness and headaches.    Immunization History  Administered Date(s) Administered   Covid-19, Mrna,Vaccine(Spikevax)6yrs and older 03/10/2022   Influenza Split 04/03/2011, 04/12/2012, 01/02/2019, 01/24/2020   Influenza, High Dose Seasonal PF 01/10/2021, 01/28/2022   Influenza,inj,Quad PF,6+ Mos 01/31/2016, 02/26/2017, 02/11/2018   Influenza,inj,quad, With Preservative  02/16/2014, 03/08/2015   Moderna SARS-COV2 Booster Vaccination 03/10/2022   PFIZER(Purple Top)SARS-COV-2 Vaccination 05/19/2019, 06/09/2019, 03/04/2020, 11/13/2020   Pfizer Covid-19 Vaccine Bivalent Booster 13yrs & up 02/05/2021   Pneumococcal Conjugate-13 05/30/2014   Pneumococcal Polysaccharide-23 04/12/2012   Respiratory  Syncytial Virus Vaccine,Recomb Aduvanted(Arexvy) 01/28/2022   Tdap 09/28/2008   Tetanus 03/12/2022   Zoster Recombinat (Shingrix) 12/22/2018, 03/01/2019   Zoster, Live 04/28/2012   Pertinent  Health Maintenance Due  Topic Date Due   COLONOSCOPY (Pts 45-7yrs Insurance coverage will need to be confirmed)  12/15/2026   INFLUENZA VACCINE  Completed   DEXA SCAN  Completed      05/06/2021    9:56 AM 09/05/2021    8:55 AM 10/07/2021   10:05 AM 03/09/2022   10:25 AM 03/16/2022    8:26 AM  Fall Risk  Falls in the past year? 0 0 0 0 0  Was there an injury with Fall? 0 0 0 0 0  Fall Risk Category Calculator 0 0 0 0 0  Fall Risk Category Low Low Low Low Low  Patient Fall Risk Level Low fall risk Low fall risk Low fall risk Low fall risk Low fall risk  Patient at Risk for Falls Due to No Fall Risks No Fall Risks No Fall Risks No Fall Risks No Fall Risks  Fall risk Follow up Falls evaluation completed Falls evaluation completed Falls evaluation completed Falls evaluation completed Falls evaluation completed   Functional Status Survey:    Vitals:   03/16/22 0810  BP: 110/70  Pulse: 97  Resp: 16  Temp: 98.3 F (36.8 C)  SpO2: 99%  Weight: 84 lb (38.1 kg)  Height: 5\' 2"  (1.575 m)   Body mass index is 15.36 kg/m. Physical Exam Vitals reviewed.  Constitutional:      General: She is not in acute distress.    Appearance: Normal appearance. She is underweight. She is not ill-appearing or diaphoretic.  HENT:     Head: Normocephalic.     Right Ear: Tympanic membrane, ear canal and external ear normal. There is no impacted cerumen.     Left Ear: There is impacted cerumen.     Ears:     Comments: Left ear cerumen lavaged with warm water and hydrogen peroxide moderate amounts of cerumen obtained using curette used Tolerated procedure well.TM clear without any signs of infection.     Nose: Nose normal. No congestion or rhinorrhea.     Mouth/Throat:     Mouth: Mucous membranes are moist.      Pharynx: Oropharynx is clear. No oropharyngeal exudate or posterior oropharyngeal erythema.  Cardiovascular:     Rate and Rhythm: Normal rate and regular rhythm.     Pulses: Normal pulses.     Heart sounds: Normal heart sounds. No murmur heard.    No friction rub. No gallop.  Pulmonary:     Effort: Pulmonary effort is normal. No respiratory distress.     Breath sounds: Normal breath sounds. No wheezing, rhonchi or rales.  Chest:     Chest wall: No tenderness.  Skin:    General: Skin is warm and dry.     Coloration: Skin is not pale.     Findings: No erythema.  Neurological:     Mental Status: She is alert. Mental status is at baseline.     Gait: Gait normal.  Psychiatric:        Mood and Affect: Mood normal.        Speech: Speech normal.  Behavior: Behavior normal.     Labs reviewed: Recent Labs    05/02/21 0820 09/10/21 0903  NA 141 143  K 3.9 4.1  CL 103 105  CO2 29 29  GLUCOSE 86 90  BUN 11 17  CREATININE 0.66 0.73  CALCIUM 9.9 9.2   Recent Labs    05/02/21 0820  AST 23  ALT 21  BILITOT 0.7  PROT 8.1   Recent Labs    05/02/21 0820 09/10/21 0903  WBC 2.5* 2.1*  NEUTROABS 1,180* 632*  HGB 14.1 13.3  HCT 42.4 40.8  MCV 94.4 97.1  PLT 210 190   Lab Results  Component Value Date   TSH 2.06 05/02/2021   No results found for: "HGBA1C" Lab Results  Component Value Date   CHOL 167 09/10/2021   HDL 78 09/10/2021   LDLCALC 75 09/10/2021   TRIG 61 09/10/2021   CHOLHDL 2.1 09/10/2021    Significant Diagnostic Results in last 30 days:  No results found.  Assessment/Plan   Impacted cerumen of left ear Left ear cerumen lavaged with warm water and hydrogen peroxide moderate amounts of cerumen obtained using curette used Tolerated procedure well.TM clear without any signs of infection. - Advised to notify provider any pain in the ear,fever or chills  Family/ staff Communication: Reviewed plan of care with patient verbalized  understanding.  Labs/tests ordered: None   Next Appointment: Return in about 1 year (around 03/17/2023) for Annual wellness visit.   Caesar Bookman, NP

## 2022-03-17 ENCOUNTER — Encounter: Payer: Self-pay | Admitting: Psychology

## 2022-03-17 LAB — COMPLETE METABOLIC PANEL WITH GFR
AG Ratio: 1.3 (calc) (ref 1.0–2.5)
ALT: 16 U/L (ref 6–29)
AST: 21 U/L (ref 10–35)
Albumin: 4.2 g/dL (ref 3.6–5.1)
Alkaline phosphatase (APISO): 49 U/L (ref 37–153)
BUN/Creatinine Ratio: 25 (calc) — ABNORMAL HIGH (ref 6–22)
BUN: 14 mg/dL (ref 7–25)
CO2: 31 mmol/L (ref 20–32)
Calcium: 9.3 mg/dL (ref 8.6–10.4)
Chloride: 103 mmol/L (ref 98–110)
Creat: 0.55 mg/dL — ABNORMAL LOW (ref 0.60–1.00)
Globulin: 3.2 g/dL (calc) (ref 1.9–3.7)
Glucose, Bld: 88 mg/dL (ref 65–99)
Potassium: 4.2 mmol/L (ref 3.5–5.3)
Sodium: 139 mmol/L (ref 135–146)
Total Bilirubin: 0.5 mg/dL (ref 0.2–1.2)
Total Protein: 7.4 g/dL (ref 6.1–8.1)
eGFR: 96 mL/min/{1.73_m2} (ref >=60)

## 2022-03-17 LAB — CBC WITH DIFFERENTIAL/PLATELET
Absolute Monocytes: 304 cells/uL (ref 200–950)
Basophils Absolute: 50 cells/uL (ref 0–200)
Basophils Relative: 1.5 %
Eosinophils Absolute: 30 cells/uL (ref 15–500)
Eosinophils Relative: 0.9 %
HCT: 39.1 % (ref 35.0–45.0)
Hemoglobin: 13.1 g/dL (ref 11.7–15.5)
Lymphs Abs: 1043 cells/uL (ref 850–3900)
MCH: 31.5 pg (ref 27.0–33.0)
MCHC: 33.5 g/dL (ref 32.0–36.0)
MCV: 94 fL (ref 80.0–100.0)
MPV: 10.2 fL (ref 7.5–12.5)
Monocytes Relative: 9.2 %
Neutro Abs: 1874 cells/uL (ref 1500–7800)
Neutrophils Relative %: 56.8 %
Platelets: 197 10*3/uL (ref 140–400)
RBC: 4.16 10*6/uL (ref 3.80–5.10)
RDW: 11.8 % (ref 11.0–15.0)
Total Lymphocyte: 31.6 %
WBC: 3.3 10*3/uL — ABNORMAL LOW (ref 3.8–10.8)

## 2022-03-17 LAB — LIPID PANEL
Cholesterol: 162 mg/dL (ref <200)
HDL: 73 mg/dL (ref >=50)
LDL Cholesterol (Calc): 75 mg/dL (calc)
Non-HDL Cholesterol (Calc): 89 mg/dL (calc) (ref <130)
Total CHOL/HDL Ratio: 2.2 (calc) (ref <5.0)
Triglycerides: 56 mg/dL (ref <150)

## 2022-03-17 LAB — VITAMIN B12: Vitamin B-12: 692 pg/mL (ref 200–1100)

## 2022-03-18 ENCOUNTER — Telehealth: Payer: Self-pay

## 2022-03-18 NOTE — Telephone Encounter (Signed)
At 11:24 am I received a reply from the referral team via Teams Messaging App stating she will check different locations.

## 2022-03-18 NOTE — Telephone Encounter (Signed)
Patients son called stating patient was referred to a psychologist and the earliest they can get her in is June of next year and he would like referral re-worked for a different location in which patient can be seen sooner.  Message was sent to the offsite referral team at 11:15 am on 03/18/22, awaiting reply

## 2022-03-24 NOTE — Telephone Encounter (Signed)
Referral team to forward order to another psychologist though from previous experience we have limited psychologist in town.

## 2022-03-24 NOTE — Telephone Encounter (Signed)
Patient's son called office again about referral for patient. He would like a call back when referral is changed.

## 2022-03-25 NOTE — Telephone Encounter (Signed)
Its been looking into by [facility].

## 2022-04-08 ENCOUNTER — Ambulatory Visit: Payer: Medicare Other | Admitting: Family

## 2022-04-23 NOTE — Telephone Encounter (Signed)
Will have referral coordinator forward order to [facility] as requested.

## 2022-04-26 ENCOUNTER — Other Ambulatory Visit: Payer: Self-pay | Admitting: Family

## 2022-04-26 DIAGNOSIS — F419 Anxiety disorder, unspecified: Secondary | ICD-10-CM

## 2022-04-28 ENCOUNTER — Other Ambulatory Visit: Payer: Self-pay | Admitting: Family

## 2022-04-28 DIAGNOSIS — F419 Anxiety disorder, unspecified: Secondary | ICD-10-CM

## 2022-05-01 NOTE — Telephone Encounter (Signed)
Message sent to [facility], Referral Coordinator

## 2022-05-12 NOTE — Telephone Encounter (Signed)
Another Message has been sent to Harle Battiest, Referral coordinator, regarding the status of referral.

## 2022-05-15 NOTE — Telephone Encounter (Signed)
I electronically re-submitted referral order and last to office notes to [facility].

## 2022-07-06 DIAGNOSIS — Z1231 Encounter for screening mammogram for malignant neoplasm of breast: Secondary | ICD-10-CM | POA: Diagnosis not present

## 2022-07-06 LAB — HM MAMMOGRAPHY

## 2022-07-28 ENCOUNTER — Other Ambulatory Visit: Payer: Self-pay | Admitting: Nurse Practitioner

## 2022-07-28 DIAGNOSIS — F419 Anxiety disorder, unspecified: Secondary | ICD-10-CM

## 2022-09-07 ENCOUNTER — Ambulatory Visit (INDEPENDENT_AMBULATORY_CARE_PROVIDER_SITE_OTHER): Payer: Medicare Other | Admitting: Family

## 2022-09-07 ENCOUNTER — Encounter: Payer: Self-pay | Admitting: Family

## 2022-09-07 VITALS — BP 118/62 | HR 88 | Temp 98.6°F | Ht 62.0 in | Wt 83.0 lb

## 2022-09-07 DIAGNOSIS — M810 Age-related osteoporosis without current pathological fracture: Secondary | ICD-10-CM

## 2022-09-07 DIAGNOSIS — E785 Hyperlipidemia, unspecified: Secondary | ICD-10-CM | POA: Diagnosis not present

## 2022-09-07 DIAGNOSIS — I10 Essential (primary) hypertension: Secondary | ICD-10-CM

## 2022-09-07 DIAGNOSIS — F419 Anxiety disorder, unspecified: Secondary | ICD-10-CM

## 2022-09-07 DIAGNOSIS — F32A Depression, unspecified: Secondary | ICD-10-CM

## 2022-09-07 MED ORDER — CALCIUM CITRATE + D 250-5 MG-MCG PO TABS: 1.0000 | ORAL_TABLET | Freq: Every day | ORAL | 1 refills | Status: AC

## 2022-09-07 NOTE — Progress Notes (Signed)
Provider: Richarda Blade FNP-C   Dillie Burandt, [name], NP  Patient Care Team: Brittney Mucha, [name], NP as PCP - General (Family Medicine) Antony Contras, MD as Consulting Physician (Ophthalmology)  Extended Emergency Contact Information Primary Emergency Contact: [name],[name] Address: [address] [place]  [address] Home Phone: [phone] Relation: Son Secondary Emergency Contact: [name] of [place] Home Phone: [phone] Mobile Phone: [phone] Relation: Sister  Code Status:  Full Code  Goals of care: Advanced Directive information    03/16/2022    8:26 AM  Advanced Directives  Does Patient Have a Medical Advance Directive? Yes  Type of Advance Directive Healthcare Power of Attorney  Does patient want to make changes to medical advance directive? No - Patient declined  Copy of Healthcare Power of Attorney in Chart? Yes - validated most recent copy scanned in chart (See row information)     Chief Complaint  Patient presents with   Medical Management of Chronic Issues    Patient presents today for a 6 month follow-up   Quality Metric Gaps    AWV, COVID#7    HPI:  Pt is a 77 y.o. female seen today for 6 months follow up for medical management of chronic diseases.she is here with her son Lauree Chandler who assist with translation.Patient also speaks few words in [place].    Hypertension - on amlodipine.No home B/p for review.she denies any headache,dizziness,vision changes,fatigue,chest tightness,palpitation,chest pain or shortness of breath.     Hyperlipidemia - states eating healthy 3 meals includes veggies and protein.walks 2-3 times per week for about a mile.   Depression and anxiety - on Cymbalta states stable.  Osteoporosis - T-score -2.5 on 10/02/2021 reports no fall or fractures.    Memory loss - has appointment with Neurology next months.son states memory stable " about the same as previous visit". Lives by herself and does own  activity of daily living. No wondering reported.  Has lost one pound weight since last visit.states drinks Ensure daily.   Past Medical History:  Diagnosis Date   H/O mammogram 2022   patient does Mammogram annually.   Hx of colonoscopy 2018   2008 as well   Hypertension    Past Surgical History:  Procedure Laterality Date   CATARACT EXTRACTION Bilateral 2018   Antony Contras    Allergies  Allergen Reactions   Sertraline Hcl Other (See Comments)    Allergies as of 09/07/2022       Reactions   Sertraline Hcl Other (See Comments)        Medication List        Accurate as of Sep 07, 2022  1:06 PM. If you have any questions, ask your nurse or doctor.          amLODipine 5 MG tablet Commonly known as: NORVASC Take 1 tablet by mouth once daily   Debrox 6.5 % OTIC solution Generic drug: carbamide peroxide Place 5 drops into the left ear 2 (two) times daily.   DULoxetine 20 MG capsule Commonly known as: CYMBALTA Take 1 capsule by mouth once daily   OVER THE COUNTER MEDICATION Take 1 capsule by mouth daily. Calcium with Vitamin B12 ... 600mg .   Please confirm with patient at appointment if Calcium is 600mg  and Vitamin B12 MG?   Polyethyl Glycol-Propyl Glycol 0.4-0.3 % Soln Apply 1 drop to eye as needed.        Review of Systems  Constitutional:  Negative for appetite change, chills, fatigue, fever and unexpected weight change.  HENT:  Negative for congestion, dental problem, ear discharge, ear pain, facial swelling, hearing loss, nosebleeds, postnasal drip, rhinorrhea, sinus pressure, sinus pain, sneezing, sore throat, tinnitus and trouble swallowing.   Eyes:  Negative for pain, discharge, redness, itching and visual disturbance.  Respiratory:  Negative for cough, chest tightness, shortness of breath and wheezing.   Cardiovascular:  Negative for chest pain, palpitations and leg swelling.  Gastrointestinal:  Negative for abdominal distention, abdominal pain,  blood in stool, constipation, diarrhea, nausea and vomiting.  Endocrine: Negative for cold intolerance, heat intolerance, polydipsia, polyphagia and polyuria.  Genitourinary:  Negative for difficulty urinating, dysuria, flank pain, frequency and urgency.  Musculoskeletal:  Negative for arthralgias, back pain, gait problem, joint swelling, myalgias, neck pain and neck stiffness.  Skin:  Negative for color change, pallor, rash and wound.  Neurological:  Negative for dizziness, syncope, speech difficulty, weakness, light-headedness, numbness and headaches.  Hematological:  Does not bruise/bleed easily.  Psychiatric/Behavioral:  Negative for agitation, behavioral problems, confusion, hallucinations, self-injury, sleep disturbance and suicidal ideas. The patient is not nervous/anxious.        Memory lapse     Immunization History  Administered Date(s) Administered   Covid-19, Mrna,Vaccine(Spikevax)12yrs and older 03/10/2022   Influenza Split 04/03/2011, 04/12/2012, 01/02/2019, 01/24/2020   Influenza, High Dose Seasonal PF 01/10/2021, 01/28/2022   Influenza,inj,Quad PF,6+ Mos 01/31/2016, 02/26/2017, 02/11/2018   Influenza,inj,quad, With Preservative 02/16/2014, 03/08/2015   Moderna SARS-COV2 Booster Vaccination 03/10/2022   PFIZER(Purple Top)SARS-COV-2 Vaccination 05/19/2019, 06/09/2019, 03/04/2020, 11/13/2020   Pfizer Covid-19 Vaccine Bivalent Booster 87yrs & up 02/05/2021   Pneumococcal Conjugate-13 05/30/2014   Pneumococcal Polysaccharide-23 04/12/2012   Respiratory Syncytial Virus Vaccine,Recomb Aduvanted(Arexvy) 01/28/2022   Tdap 09/28/2008   Tetanus 03/12/2022   Zoster Recombinat (Shingrix) 12/22/2018, 03/01/2019   Zoster, Live 04/28/2012   Pertinent  Health Maintenance Due  Topic Date Due   INFLUENZA VACCINE  11/26/2022   COLONOSCOPY (Pts 45-15yrs Insurance coverage will need to be confirmed)  12/15/2026   DEXA SCAN  Completed      05/06/2021    9:56 AM 09/05/2021    8:55 AM  10/07/2021   10:05 AM 03/09/2022   10:25 AM 03/16/2022    8:26 AM  Fall Risk  Falls in the past year? 0 0 0 0 0  Was there an injury with Fall? 0 0 0 0 0  Fall Risk Category Calculator 0 0 0 0 0  Fall Risk Category (Retired) Low Low Low Low Low  (RETIRED) Patient Fall Risk Level Low fall risk Low fall risk Low fall risk Low fall risk Low fall risk  Patient at Risk for Falls Due to No Fall Risks No Fall Risks No Fall Risks No Fall Risks No Fall Risks  Fall risk Follow up Falls evaluation completed Falls evaluation completed Falls evaluation completed Falls evaluation completed Falls evaluation completed   Functional Status Survey:    Vitals:   09/07/22 1300  BP: 118/62  Pulse: 88  Temp: 98.6 F (37 C)  SpO2: 97%  Weight: 83 lb (37.6 kg)  Height: 5\' 2"  (1.575 m)   Body mass index is 15.18 kg/m. Physical Exam Vitals reviewed.  Constitutional:      General: She is not in acute distress.    Appearance: Normal appearance. She is underweight. She is not ill-appearing or diaphoretic.  HENT:     Head: Normocephalic.     Right Ear: Tympanic membrane, ear canal and external ear  normal. There is no impacted cerumen.     Left Ear: Tympanic membrane, ear canal and external ear normal. There is no impacted cerumen.     Ears:     Comments: Bilateral hearing aids in place     Nose: Nose normal. No congestion or rhinorrhea.     Mouth/Throat:     Mouth: Mucous membranes are moist.     Pharynx: Oropharynx is clear. No oropharyngeal exudate or posterior oropharyngeal erythema.  Eyes:     General: No scleral icterus.       Right eye: No discharge.        Left eye: No discharge.     Extraocular Movements: Extraocular movements intact.     Conjunctiva/sclera: Conjunctivae normal.     Pupils: Pupils are equal, round, and reactive to light.  Neck:     Vascular: No carotid bruit.  Cardiovascular:     Rate and Rhythm: Normal rate and regular rhythm.     Pulses: Normal pulses.     Heart  sounds: Normal heart sounds. No murmur heard.    No friction rub. No gallop.  Pulmonary:     Effort: Pulmonary effort is normal. No respiratory distress.     Breath sounds: Normal breath sounds. No wheezing, rhonchi or rales.  Chest:     Chest wall: No tenderness.  Abdominal:     General: Bowel sounds are normal. There is no distension.     Palpations: Abdomen is soft. There is no mass.     Tenderness: There is no abdominal tenderness. There is no right CVA tenderness, left CVA tenderness, guarding or rebound.  Musculoskeletal:        General: No swelling or tenderness. Normal range of motion.     Cervical back: Normal range of motion. No rigidity or tenderness.     Right lower leg: No edema.     Left lower leg: No edema.  Lymphadenopathy:     Cervical: No cervical adenopathy.  Skin:    General: Skin is warm and dry.     Coloration: Skin is not pale.     Findings: No bruising, erythema, lesion or rash.  Neurological:     Mental Status: She is alert. Mental status is at baseline.     Cranial Nerves: No cranial nerve deficit.     Sensory: No sensory deficit.     Motor: No weakness.     Coordination: Coordination normal.     Gait: Gait normal.  Psychiatric:        Mood and Affect: Mood normal.        Speech: Speech normal.        Behavior: Behavior normal.        Thought Content: Thought content normal.        Judgment: Judgment normal.     Labs reviewed: Recent Labs    09/10/21 0903 03/16/22 0809  NA 143 139  K 4.1 4.2  CL 105 103  CO2 29 31  GLUCOSE 90 88  BUN 17 14  CREATININE 0.73 0.55*  CALCIUM 9.2 9.3   Recent Labs    03/16/22 0809  AST 21  ALT 16  BILITOT 0.5  PROT 7.4   Recent Labs    09/10/21 0903 03/16/22 0809  WBC 2.1* 3.3*  NEUTROABS 632* 1,874  HGB 13.3 13.1  HCT 40.8 39.1  MCV 97.1 94.0  PLT 190 197   Lab Results  Component Value Date   TSH 2.06 05/02/2021   No  results found for: "HGBA1C" Lab Results  Component Value Date   CHOL  162 03/16/2022   HDL 73 03/16/2022   LDLCALC 75 03/16/2022   TRIG 56 03/16/2022   CHOLHDL 2.2 03/16/2022    Significant Diagnostic Results in last 30 days:  No results found.  Assessment/Plan 1. Essential hypertension, benign B/p well controlled  - continue on amlodipine  - continue dietary modification and exercise at least three times per week for 30 minutes.  - TSH; Future - COMPLETE METABOLIC PANEL WITH GFR; Future - CBC with Differential/Platelet; Future  2. Hyperlipidemia LDL goal <100 LDL at goal  - continue dietary modification and exercise as above  - Lipid panel; Future  3. Anxiety and depression Mood stable  - continue on Duloxetine  - TSH; Future  4. Age-related osteoporosis without current pathological fracture Bone density done 10/02/2021 indicated right total femur T-score - 2.5  - continue on Calcium and Vitamin D supplement - Calcium Citrate-Vitamin D (CALCIUM CITRATE + D) 250-5 MG-MCG TABS; Take 1 tablet by mouth daily.  Dispense: 90 tablet; Refill: 1  Family/ staff Communication: Reviewed plan of care with patient and son verbalized understanding   Labs/tests ordered:  - TSH; Future - COMPLETE METABOLIC PANEL WITH GFR; Future - CBC with Differential/Platelet; Future - Lipid panel; Future  Next Appointment : Return in about 6 months (around 03/10/2023) for medical mangement of chronic issues.Fasting labs tomorrow , Annual wellness visit.   Caesar Bookman, NP

## 2022-09-08 ENCOUNTER — Other Ambulatory Visit: Payer: Medicare Other

## 2022-09-08 DIAGNOSIS — F419 Anxiety disorder, unspecified: Secondary | ICD-10-CM

## 2022-09-08 DIAGNOSIS — F32A Depression, unspecified: Secondary | ICD-10-CM | POA: Diagnosis not present

## 2022-09-08 DIAGNOSIS — E785 Hyperlipidemia, unspecified: Secondary | ICD-10-CM

## 2022-09-08 DIAGNOSIS — I10 Essential (primary) hypertension: Secondary | ICD-10-CM

## 2022-09-09 LAB — CBC WITH DIFFERENTIAL/PLATELET
Absolute Monocytes: 333 cells/uL (ref 200–950)
Basophils Absolute: 39 cells/uL (ref 0–200)
Basophils Relative: 1.4 %
Eosinophils Absolute: 50 cells/uL (ref 15–500)
Eosinophils Relative: 1.8 %
HCT: 39.4 % (ref 35.0–45.0)
Hemoglobin: 13.1 g/dL (ref 11.7–15.5)
Lymphs Abs: 1148 cells/uL (ref 850–3900)
MCH: 31.2 pg (ref 27.0–33.0)
MCHC: 33.2 g/dL (ref 32.0–36.0)
MCV: 93.8 fL (ref 80.0–100.0)
MPV: 10.5 fL (ref 7.5–12.5)
Monocytes Relative: 11.9 %
Neutro Abs: 1229 cells/uL — ABNORMAL LOW (ref 1500–7800)
Neutrophils Relative %: 43.9 %
Platelets: 186 10*3/uL (ref 140–400)
RBC: 4.2 10*6/uL (ref 3.80–5.10)
RDW: 11.8 % (ref 11.0–15.0)
Total Lymphocyte: 41 %
WBC: 2.8 10*3/uL — ABNORMAL LOW (ref 3.8–10.8)

## 2022-09-09 LAB — COMPLETE METABOLIC PANEL WITH GFR
AG Ratio: 1.3 (calc) (ref 1.0–2.5)
ALT: 12 U/L (ref 6–29)
AST: 18 U/L (ref 10–35)
Albumin: 4 g/dL (ref 3.6–5.1)
Alkaline phosphatase (APISO): 54 U/L (ref 37–153)
BUN/Creatinine Ratio: 28 (calc) — ABNORMAL HIGH (ref 6–22)
BUN: 16 mg/dL (ref 7–25)
CO2: 31 mmol/L (ref 20–32)
Calcium: 9.1 mg/dL (ref 8.6–10.4)
Chloride: 104 mmol/L (ref 98–110)
Creat: 0.58 mg/dL — ABNORMAL LOW (ref 0.60–1.00)
Globulin: 3.2 g/dL (calc) (ref 1.9–3.7)
Glucose, Bld: 89 mg/dL (ref 65–99)
Potassium: 4.3 mmol/L (ref 3.5–5.3)
Sodium: 140 mmol/L (ref 135–146)
Total Bilirubin: 0.5 mg/dL (ref 0.2–1.2)
Total Protein: 7.2 g/dL (ref 6.1–8.1)
eGFR: 94 mL/min/{1.73_m2} (ref >=60)

## 2022-09-09 LAB — LIPID PANEL
Cholesterol: 157 mg/dL (ref <200)
HDL: 67 mg/dL (ref >=50)
LDL Cholesterol (Calc): 76 mg/dL (calc)
Non-HDL Cholesterol (Calc): 90 mg/dL (calc) (ref <130)
Total CHOL/HDL Ratio: 2.3 (calc) (ref <5.0)
Triglycerides: 59 mg/dL (ref <150)

## 2022-09-09 LAB — TSH: TSH: 2.11 mIU/L (ref 0.40–4.50)

## 2022-09-23 ENCOUNTER — Other Ambulatory Visit: Payer: Self-pay | Admitting: Family

## 2022-09-23 DIAGNOSIS — D709 Neutropenia, unspecified: Secondary | ICD-10-CM

## 2022-09-23 NOTE — Progress Notes (Signed)
Hematology referral ordered.

## 2022-10-12 ENCOUNTER — Encounter: Payer: Self-pay | Admitting: Psychology

## 2022-10-12 ENCOUNTER — Encounter: Payer: Medicare Other | Attending: Psychology | Admitting: Psychology

## 2022-10-12 DIAGNOSIS — G4752 REM sleep behavior disorder: Secondary | ICD-10-CM

## 2022-10-12 DIAGNOSIS — R413 Other amnesia: Secondary | ICD-10-CM

## 2022-10-12 DIAGNOSIS — F419 Anxiety disorder, unspecified: Secondary | ICD-10-CM

## 2022-10-12 DIAGNOSIS — F5102 Adjustment insomnia: Secondary | ICD-10-CM

## 2022-10-12 DIAGNOSIS — F32A Depression, unspecified: Secondary | ICD-10-CM | POA: Diagnosis not present

## 2022-10-12 NOTE — Progress Notes (Signed)
Neuropsychological Consultation   Patient:   Bethany Mcintosh   DOB:   [birth year]  MR Number:  [id number]  Location:  [facility] FOR PAIN AND [facility] MEDICINE [facility] PHYSICAL MEDICINE & REHABILITATION [address] [place], [id number] [facility] [place] [address] Dept: [phone]           Date of Service:   10/12/2022  Location of Service and Individuals present: Today's visit was conducted in my outpatient clinic office with the patient and myself present in the room and a telemetry translator available over iPad.  Patient speaks almost exclusively [place]ones[place] and only knows a very limited number of English words.  Translation services worked well throughout and it allowed for effective flow of communication.  Start Time:   1 PM End Time:   3 PM  Patient Consent and Confidentiality: Patient consented to limits of confidentiality including the fact that she had been referred for a neuropsychological evaluation with notes being provided to her primary care physician's office as well as being made available in her electronic medical records.  Consent for Evaluation and Treatment:  Signed:  Yes Explanation of Privacy Policies:  Signed:  Yes Discussion of Confidentiality Limits:  Yes  Provider/Observer:  Bethany Mcintosh, Psy.D.       Clinical Neuropsychologist       Billing Code/Service: [phone]  Chief Complaint:     Chief Complaint  Patient presents with   Anxiety   Sleeping Problem   Memory Loss   Hallucinations    Auditory in nature likely due to sleep deprivation    Reason for Service:    Bethany Mcintosh is a 77 year old female referred by her family medicine practitioner Bethany Blade, NP due to reports of increasing memory difficulties in the setting of significant anxiety and to a lesser extent depressive symptomatology.  Patient has past medical history including hypertension that is being managed with medicines, headaches, dizziness, vision changes, fatigue,  chest tightening and palpitations and shortness of breath.  Patient with hyperlipidemia and history of anxiety/depression.  She is currently taking Cymbalta without apparent side effects for the past year.  Patient has described issues with her memory particularly for both episodic and semantic memories but patient and son (an earlier medical appointments with patient) reports that her memory functions have been stable over the past year or so.  Patient continues to live by herself and is managing her IADLs and ADLs effectively.  During today's clinical interview the patient was along with a video interpreter provided.  The patient reports that she has noticed some memory difficulties over the past year.  The patient has another concern regarding hearing music in the background or hearing people talk to her that are physically there.  The patient reports that hearing these auditory hallucinations/conversations are primarily in [place] and not [place].  Patient reports that she does not believe them to be real are present and it does cause her some degree of distress but she is not overwhelmed by this.  The patient reports that she can hear these voices or music at any time during the day.  Patient reports that when she is talking with other people that they are completely absent.  Patient denies any visual hallucinations.  Patient denies being scared by hearing these but she does wanted to stop.  Patient denies any changes in geographic orientation except for times when she got very anxious or nervous when driving.  The patient is effectively driving without accidents.  The  patient does acknowledge feeling more anxious than she has.  She is worried about phone calls in particular.  As with most other she is starting to get a significant increase in spam calls and now when the phone rings that it causes her to feel very anxious.  As she cannot understand what they are saying and they cannot understand what she is  saying that makes her very anxious for fear that it is some important information when in fact at least the vascular Bethany Mcintosh are nothing more than spam calls.  The patient reports that her father did begin to develop hearing voices and memory issues when he was in his 73s.  The patient denies any tremors.  Patient denies any visual hallucination.  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  The patient reports that she feels much more comfortable when her son comes to visit.  He lives in [place] and she has other friends that live in both [place] and [place].  She is extremely isolated here.  The patient has a little sister that lives in town and she interacts with some but her little sister has issues of her all that are psychosocial in nature.  The patient's sister tells her to ask her son for help rather than offering help herself.  The patient describes sustained sleep disturbance.  She reports that her sleep has improved some with her current medications including Cymbalta.  The patient reports that she will regularly wake up at night sometimes waking up to hearing voices that startle her.  The patient reports that she will typically sleep about 4 hours at night.  Patient reports that she sleeps approximately 2 hours during the day.  While she reports she feels rested in the morning she clearly is having fatigue and other symptoms likely suffering from insomnia and sleep loss.  The patient reports that she does think that she snores some but it was hard to get a clear assessment of this.  It may be worthwhile having her do a home sleep study.  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  Medical History:   Past Medical History:  Diagnosis Date   H/O mammogram 2022   patient does Mammogram annually.    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  Additional Tests and Measures from other records:  Neuroimaging Results: No neuroimaging are available in records  Current Typical Mood State:  Anxious  Sleep:  The patient describes sustained sleep disturbance.  She reports that her sleep has improved some with her current medications including Cymbalta.  The patient reports that she will regularly wake up at night sometimes waking up to hearing voices  that startle her.  The patient reports that she will typically sleep about 4 hours at night.  Patient reports that she sleeps approximately 2 hours during the day.  While she reports she feels rested in the morning she clearly is having fatigue and other symptoms likely suffering from insomnia and sleep loss.  The patient reports that she does think that she snores some but it was hard to get a clear assessment of this.  It may be worthwhile having her do a home sleep study.  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  Diet Pattern: Patient has a good diet and has lost 1 pound over a period of time between doctor visits.  Behavioral Observation/Mental Status:   Genya Marek  presents as a 77 y.o.-year-old Right handed Asian Female who appeared her  stated age. her dress was Appropriate and she was Well Groomed and her manners were Appropriate to the situation.  her participation was indicative of Appropriate and Attentive behaviors.  There were not physical disabilities noted.  she displayed an appropriate level of cooperation and motivation.    Interactions:    Active Appropriate  Attention:   abnormal and attention span appeared shorter than expected for age  Memory:   within normal limits; recent and remote memory intact  Visuo-spatial:   not examined  Speech (Volume):  low  Speech:   normal; normal  Thought Process:  Coherent and Relevant  Coherent, Directed, and Logical  Though Content:  WNL; not suicidal and not homicidal  Orientation:   person, place, time/date, and situation  Judgment:   Fair  Planning:   Fair  Affect:    Anxious  Mood:    Anxious  Insight:   Fair  Intelligence:   normal  Marital Status/Living:  Patient was born and raised in [place] along with 3 siblings.  She has lived in [place] for roughly 40 years.  She was married previously and was married for 22 years and divorced in 2004.  The patient has a adult son who lives in [place] that helps her out some and a younger sister that lives here in [place].  The patient is seriously considering and planning a move to [place] to live where her son lives.  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  Psychiatric History:  No prior psychiatric history  History of Substance Use or Abuse:  No concerns of substance abuse are reported.    Impression/DX:   Meriah Bettinger is a 77 year old female referred by her family medicine practitioner Bethany Blade, NP due to reports of increasing memory difficulties in the setting of significant anxiety and to a lesser extent  depressive symptomatology.  Patient has past medical history including hypertension that is being managed with medicines, headaches, dizziness, vision changes, fatigue, chest tightening and palpitations and shortness of breath.  Patient with hyperlipidemia and history of anxiety/depression.  She is currently taking Cymbalta without apparent side effects for the past year.  Patient has described issues with her memory particularly for both episodic and semantic memories but patient and son (an earlier medical appointments with patient) reports that her memory functions have been stable over the  past year or so.  Patient continues to live by herself and is managing her IADLs and ADLs effectively.  Disposition/Plan:  I had the opportunity to spend more than an hour with the patient going over her symptoms and in-depth history reviewed.  At this point, I do think that the primary factors of the patient's memory changes, attention and concentration issues, and any other apprehension are primarily related to a combination of significant anxiety with some depressive symptomatology, spending much of her time alone with little interactions with others and sustained insomnia and limited sleep.  She reports that she typically gets roughly 4 hours of sleep at night but does take naps during the day and gets about 2 hours of sleep during the day.  I suspect that the auditory hallucinations are because of a combination of significant limits in her sleep causing her temporal cortex to start function as if it was in a dream state allowing for auditory hallucination.  The patient denies any visual hallucinations.  There does not appear to be any progression or worsening of memory and cognitive abilities over the past half year or more.  The patient does not have other symptoms consistent with progressive cortical dementia or losses in other cognitive domains.  She denies any geographic disorientation and there are no tremors or  changes in visual spatial visual constructional abilities.  The patient had good mental status today and she appeared to be at least an adequate historian although there were no other family members present to confirm various life history points.  I do not think that there is a need for further neuropsychological testing and I do not think that she has any type of progressive neurological condition.  Diagnosis:    Anxiety and depression  Episodic memory loss  Adjustment insomnia  REM behavioral disorder        Note: This document was prepared using Dragon voice recognition software and may include unintentional dictation errors.   Electronically Signed   _______________________ Bethany Mcintosh, Psy.D. Clinical Neuropsychologist

## 2023-01-18 DIAGNOSIS — H04123 Dry eye syndrome of bilateral lacrimal glands: Secondary | ICD-10-CM | POA: Diagnosis not present

## 2023-01-18 DIAGNOSIS — H5213 Myopia, bilateral: Secondary | ICD-10-CM | POA: Diagnosis not present

## 2023-01-18 DIAGNOSIS — H02055 Trichiasis without entropian left lower eyelid: Secondary | ICD-10-CM | POA: Diagnosis not present

## 2023-01-18 DIAGNOSIS — Z961 Presence of intraocular lens: Secondary | ICD-10-CM | POA: Diagnosis not present

## 2023-02-01 ENCOUNTER — Other Ambulatory Visit: Payer: Self-pay | Admitting: Family

## 2023-02-01 DIAGNOSIS — F32A Depression, unspecified: Secondary | ICD-10-CM

## 2023-03-10 ENCOUNTER — Ambulatory Visit: Payer: Medicare Other | Admitting: Family

## 2023-03-15 ENCOUNTER — Ambulatory Visit (INDEPENDENT_AMBULATORY_CARE_PROVIDER_SITE_OTHER): Payer: Medicare Other | Admitting: Family

## 2023-03-15 VITALS — BP 114/70 | HR 81 | Temp 97.0°F | Resp 18 | Ht 62.0 in | Wt 86.2 lb

## 2023-03-15 DIAGNOSIS — E785 Hyperlipidemia, unspecified: Secondary | ICD-10-CM | POA: Diagnosis not present

## 2023-03-15 DIAGNOSIS — Z23 Encounter for immunization: Secondary | ICD-10-CM | POA: Diagnosis not present

## 2023-03-15 DIAGNOSIS — I10 Essential (primary) hypertension: Secondary | ICD-10-CM

## 2023-03-15 DIAGNOSIS — H6121 Impacted cerumen, right ear: Secondary | ICD-10-CM

## 2023-03-15 DIAGNOSIS — F419 Anxiety disorder, unspecified: Secondary | ICD-10-CM | POA: Diagnosis not present

## 2023-03-15 DIAGNOSIS — F32A Depression, unspecified: Secondary | ICD-10-CM | POA: Diagnosis not present

## 2023-03-15 MED ORDER — HYDROXYZINE HCL 10 MG PO TABS
10.0000 mg | ORAL_TABLET | Freq: Three times a day (TID) | ORAL | 3 refills | Status: AC | PRN
Start: 2023-03-15 — End: ?

## 2023-03-15 MED ORDER — DEBROX 6.5 % OT SOLN: 5.0000 [drp] | Freq: Two times a day (BID) | OTIC | 0 refills | Status: AC

## 2023-03-15 NOTE — Progress Notes (Signed)
Provider: Richarda Blade FNP-C   Bethany Mcintosh, Bethany Citrin, NP  Patient Care Team: Gen Clagg, Bethany Citrin, NP as PCP - General (Family Medicine) Antony Contras, MD as Consulting Physician (Ophthalmology)  Extended Emergency Contact Information Primary Emergency Contact: Mcintosh,Bethany Address: [address] [place]  [address] Home Phone: [phone] Relation: Son Secondary Emergency Contact: Bethany Mcintosh of [place] Home Phone: [phone] Mobile Phone: [phone] Relation: Sister  Code Status:  Full Code  Goals of care: Advanced Directive information    03/15/2023    1:18 PM  Advanced Directives  Does Patient Have a Medical Advance Directive? Yes  Type of Estate agent of [place];Living will  Does patient want to make changes to medical advance directive? No - Patient declined  Copy of Healthcare Power of Attorney in Chart? Yes - validated most recent copy scanned in chart (See row information)     Chief Complaint  Patient presents with   Medical Management of Chronic Issues    79month follow up and discuss medicare annual wellness visit and discuss flu and covid vaccines.    HPI:  Pt is a 77 y.o. female seen today for 6 months follow up for medical management of chronic diseases.    Generalized anxiety - not sleeping well due to anxiety.Has had headache when feeling anxious.she was seen by NeuroPyschologist Dr. Arley Phenix on 10/12/2022 for evaluation of memory loss and Auditory hallucination and insomnia thought due a combination of significant limit in her sleep causing her temporal cortex to start function as if it was in a dream states thus allowing auditory hallucinations.she continues to live by herself and does own ADL's.she gets 4 hrs of sleep at night and 2 hrs of a nap during the day. No further psychological testing recommended.   Son states will be moving her to [place] to be closer to him.   Hypertension - checks  blood pressure at home but did not bring log states readings in the 110/70's.Had one episode in the 140's last week.she denies any dizziness,vision changes,fatigue,chest tightness,palpitation,chest pain or shortness of breath.   Depression - on Cymbalta states stable.Denies any SI  Larey Seat once since last visit.states was trying to get to th bathroom. States was rushing due to diarrhea. Symptoms resolved.states could be something she eat.   Due for influenza and COVID-19 vaccine. She will get COVID-19 at the pharmacy.She will get Influenza vaccine today.   Past Medical History:  Diagnosis Date   H/O mammogram 2022   patient does Mammogram annually.   Hx of colonoscopy 2018   2008 as well   Hypertension    Past Surgical History:  Procedure Laterality Date   CATARACT EXTRACTION Bilateral 2018   Antony Contras    Allergies  Allergen Reactions   Sertraline Hcl Other (See Comments)    Allergies as of 03/15/2023       Reactions   Sertraline Hcl Other (See Comments)        Medication List        Accurate as of March 15, 2023  1:51 PM. If you have any questions, ask your nurse or doctor.          amLODipine 5 MG tablet Commonly known as: NORVASC Take 1 tablet by mouth once daily   Calcium Citrate + D 250-5 MG-MCG Tabs Generic drug: Calcium Citrate-Vitamin D Take 1 tablet by mouth daily.   DULoxetine 20 MG capsule Commonly known as: CYMBALTA  Take 1 capsule by mouth once daily   OVER THE COUNTER MEDICATION Take 1 capsule by mouth daily. Calcium with Vitamin B12 ... 600mg .   Please confirm with patient at appointment if Calcium is 600mg  and Vitamin B12 MG?   Polyethyl Glycol-Propyl Glycol 0.4-0.3 % Soln Apply 1 drop to eye as needed.        Review of Systems  Constitutional:  Negative for appetite change, chills, fatigue, fever and unexpected weight change.  HENT:  Negative for congestion, dental problem, ear discharge, ear pain, facial swelling, hearing  loss, nosebleeds, postnasal drip, rhinorrhea, sinus pressure, sinus pain, sneezing, sore throat, tinnitus and trouble swallowing.   Eyes:  Negative for pain, discharge, redness, itching and visual disturbance.  Respiratory:  Negative for cough, chest tightness, shortness of breath and wheezing.   Cardiovascular:  Negative for chest pain, palpitations and leg swelling.  Gastrointestinal:  Negative for abdominal distention, abdominal pain, blood in stool, constipation, diarrhea, nausea and vomiting.  Endocrine: Negative for cold intolerance, heat intolerance, polydipsia, polyphagia and polyuria.  Genitourinary:  Negative for difficulty urinating, dysuria, flank pain, frequency and urgency.  Musculoskeletal:  Negative for arthralgias, back pain, gait problem, joint swelling, myalgias, neck pain and neck stiffness.  Skin:  Negative for color change, pallor, rash and wound.  Neurological:  Negative for dizziness, syncope, speech difficulty, weakness, light-headedness, numbness and headaches.  Hematological:  Does not bruise/bleed easily.  Psychiatric/Behavioral:  Negative for agitation, behavioral problems, confusion, hallucinations, self-injury, sleep disturbance and suicidal ideas. The patient is nervous/anxious.        Depression symptoms stable     Immunization History  Administered Date(s) Administered   Fluad Trivalent(High Dose 65+) 03/15/2023   Influenza Split 04/03/2011, 04/12/2012, 01/02/2019, 01/24/2020   Influenza, High Dose Seasonal PF 01/10/2021, 01/28/2022   Influenza,inj,Quad PF,6+ Mos 01/31/2016, 02/26/2017, 02/11/2018   Influenza,inj,quad, With Preservative 02/16/2014, 03/08/2015   Moderna Covid-19 Fall Seasonal Vaccine 88yrs & older 03/10/2022   Moderna SARS-COV2 Booster Vaccination 03/10/2022   PFIZER(Purple Top)SARS-COV-2 Vaccination 05/19/2019, 06/09/2019, 03/04/2020, 11/13/2020   Pfizer Covid-19 Vaccine Bivalent Booster 35yrs & up 02/05/2021   Pneumococcal Conjugate-13  05/30/2014   Pneumococcal Polysaccharide-23 04/12/2012   Respiratory Syncytial Virus Vaccine,Recomb Aduvanted(Arexvy) 01/28/2022   Tdap 09/28/2008   Tetanus 03/12/2022   Zoster Recombinant(Shingrix) 12/22/2018, 03/01/2019   Zoster, Live 04/28/2012   Pertinent  Health Maintenance Due  Topic Date Due   INFLUENZA VACCINE  Completed   DEXA SCAN  Completed   Colonoscopy  Discontinued      10/07/2021   10:05 AM 03/09/2022   10:25 AM 03/16/2022    8:26 AM 09/07/2022    1:06 PM 03/15/2023    1:16 PM  Fall Risk  Falls in the past year? 0 0 0 0 1  Was there an injury with Fall? 0 0 0 0 1  Fall Risk Category Calculator 0 0 0 0 2  Fall Risk Category (Retired) Low Low Low    (RETIRED) Patient Fall Risk Level Low fall risk Low fall risk Low fall risk    Patient at Risk for Falls Due to No Fall Risks No Fall Risks No Fall Risks No Fall Risks   Fall risk Follow up Falls evaluation completed Falls evaluation completed Falls evaluation completed Falls evaluation completed    Functional Status Survey:    Vitals:   03/15/23 1320  BP: 114/70  Pulse: 81  Resp: 18  Temp: (!) 97 F (36.1 C)  SpO2: 93%  Weight: 86 lb 3.2 oz (39.1 kg)  Height: 5\' 2"  (1.575 m)   Body mass index is 15.77 kg/m. Physical Exam Vitals reviewed.  Constitutional:      General: She is not in acute distress.    Appearance: Normal appearance. She is underweight. She is not ill-appearing or diaphoretic.  HENT:     Head: Normocephalic.     Right Ear: Tympanic membrane, ear canal and external ear normal. There is impacted cerumen.     Left Ear: Tympanic membrane, ear canal and external ear normal. There is no impacted cerumen.     Nose: Nose normal. No congestion or rhinorrhea.     Mouth/Throat:     Mouth: Mucous membranes are moist.     Pharynx: Oropharynx is clear. No oropharyngeal exudate or posterior oropharyngeal erythema.  Eyes:     General: No scleral icterus.       Right eye: No discharge.        Left  eye: No discharge.     Extraocular Movements: Extraocular movements intact.     Conjunctiva/sclera: Conjunctivae normal.     Pupils: Pupils are equal, round, and reactive to light.  Neck:     Vascular: No carotid bruit.  Cardiovascular:     Rate and Rhythm: Normal rate and regular rhythm.     Pulses: Normal pulses.     Heart sounds: Normal heart sounds. No murmur heard.    No friction rub. No gallop.  Pulmonary:     Effort: Pulmonary effort is normal. No respiratory distress.     Breath sounds: Normal breath sounds. No wheezing, rhonchi or rales.  Chest:     Chest wall: No tenderness.  Abdominal:     General: Bowel sounds are normal. There is no distension.     Palpations: Abdomen is soft. There is no mass.     Tenderness: There is no abdominal tenderness. There is no right CVA tenderness, left CVA tenderness, guarding or rebound.  Musculoskeletal:        General: No swelling or tenderness. Normal range of motion.     Cervical back: Normal range of motion. No rigidity or tenderness.     Right lower leg: No edema.     Left lower leg: No edema.  Lymphadenopathy:     Cervical: No cervical adenopathy.  Skin:    General: Skin is warm and dry.     Coloration: Skin is not pale.     Findings: No bruising, erythema, lesion or rash.  Neurological:     Mental Status: She is alert and oriented to person, place, and time.     Cranial Nerves: No cranial nerve deficit.     Sensory: No sensory deficit.     Motor: No weakness.     Coordination: Coordination normal.     Gait: Gait normal.  Psychiatric:        Mood and Affect: Mood normal.        Speech: Speech normal.        Behavior: Behavior normal.        Thought Content: Thought content normal.        Judgment: Judgment normal.     Labs reviewed: Recent Labs    03/16/22 0809 09/08/22 0826  NA 139 140  K 4.2 4.3  CL 103 104  CO2 31 31  GLUCOSE 88 89  BUN 14 16  CREATININE 0.55* 0.58*  CALCIUM 9.3 9.1   Recent Labs     03/16/22 0809 09/08/22 0826  AST 21 18  ALT 16  12  BILITOT 0.5 0.5  PROT 7.4 7.2   Recent Labs    03/16/22 0809 09/08/22 0826  WBC 3.3* 2.8*  NEUTROABS 1,874 1,229*  HGB 13.1 13.1  HCT 39.1 39.4  MCV 94.0 93.8  PLT 197 186   Lab Results  Component Value Date   TSH 2.11 09/08/2022   No results found for: "HGBA1C" Lab Results  Component Value Date   CHOL 157 09/08/2022   HDL 67 09/08/2022   LDLCALC 76 09/08/2022   TRIG 59 09/08/2022   CHOLHDL 2.3 09/08/2022    Significant Diagnostic Results in last 30 days:  No results found.  Assessment/Plan 1. Need for influenza vaccination Afebrile  Flu shot administered by CMA no acute reaction reported.  - Flu Vaccine Trivalent High Dose (Fluad)   2. Essential hypertension, benign -Blood pressure well-controlled -Dietary modification and exercise at least 30 minutes three times per week advised -Continue amlodipine - TSH - COMPLETE METABOLIC PANEL WITH GFR - CBC with Differential/Platelet  4. Hyperlipidemia LDL goal <100 Previous LDL at goal -Dietary modification and exercise as above - Lipid panel  5. Anxiety and depression Mood stable but not sleeping well at night due to anxiety -Start on hydroxyzine - hydrOXYzine (ATARAX) 10 MG tablet; Take 1 tablet (10 mg total) by mouth 3 (three) times daily as needed.  Dispense: 30 tablet; Refill: 3 - TSH  6. Impacted cerumen of right ear tympanic membrane not visualized  - Instill debrox 6.5 otic solution 5 drops into each ear twice daily x 4 days then follow up for ear lavage.May apply cotton ball at bedtime to prevent drainage to pillow. - carbamide peroxide (DEBROX) 6.5 % OTIC solution; Place 5 drops into the right ear 2 (two) times daily for 4 days.  Dispense: 2 mL; Refill: 0  Family/ staff Communication: Reviewed plan of care with patient and son verbalized understanding  Labs/tests ordered:  - TSH - COMPLETE METABOLIC PANEL WITH GFR - CBC with  Differential/Platelet - Lipid panel  Next Appointment :Return in about 6 months (around 09/12/2023) for medical mangement of chronic issues. right ear lavage in one week.    Caesar Bookman, NP

## 2023-03-15 NOTE — Patient Instructions (Signed)
-   Instill debrox 6.5 otic solution 5 drops into right ear twice daily x 4 days then follow up for ear lavage.May apply cotton ball at bedtime to prevent drainage to pillow.  

## 2023-03-16 DIAGNOSIS — Z23 Encounter for immunization: Secondary | ICD-10-CM | POA: Diagnosis not present

## 2023-03-16 LAB — COMPLETE METABOLIC PANEL WITH GFR
AG Ratio: 1.3 (calc) (ref 1.0–2.5)
ALT: 19 U/L (ref 6–29)
AST: 24 U/L (ref 10–35)
Albumin: 4.4 g/dL (ref 3.6–5.1)
Alkaline phosphatase (APISO): 55 U/L (ref 37–153)
BUN: 17 mg/dL (ref 7–25)
CO2: 30 mmol/L (ref 20–32)
Calcium: 9.5 mg/dL (ref 8.6–10.4)
Chloride: 103 mmol/L (ref 98–110)
Creat: 0.6 mg/dL (ref 0.60–1.00)
Globulin: 3.3 g/dL (ref 1.9–3.7)
Glucose, Bld: 93 mg/dL (ref 65–139)
Potassium: 4.7 mmol/L (ref 3.5–5.3)
Sodium: 140 mmol/L (ref 135–146)
Total Bilirubin: 0.4 mg/dL (ref 0.2–1.2)
Total Protein: 7.7 g/dL (ref 6.1–8.1)
eGFR: 93 mL/min/{1.73_m2} (ref >=60)

## 2023-03-16 LAB — CBC WITH DIFFERENTIAL/PLATELET
Absolute Lymphocytes: 1169 {cells}/uL (ref 850–3900)
Absolute Monocytes: 350 {cells}/uL (ref 200–950)
Basophils Absolute: 50 {cells}/uL (ref 0–200)
Basophils Relative: 1.6 %
Eosinophils Absolute: 31 {cells}/uL (ref 15–500)
Eosinophils Relative: 1 %
HCT: 41.4 % (ref 35.0–45.0)
Hemoglobin: 13.7 g/dL (ref 11.7–15.5)
MCH: 30.9 pg (ref 27.0–33.0)
MCHC: 33.1 g/dL (ref 32.0–36.0)
MCV: 93.5 fL (ref 80.0–100.0)
MPV: 10.7 fL (ref 7.5–12.5)
Monocytes Relative: 11.3 %
Neutro Abs: 1500 {cells}/uL (ref 1500–7800)
Neutrophils Relative %: 48.4 %
Platelets: 211 10*3/uL (ref 140–400)
RBC: 4.43 10*6/uL (ref 3.80–5.10)
RDW: 11.6 % (ref 11.0–15.0)
Total Lymphocyte: 37.7 %
WBC: 3.1 10*3/uL — ABNORMAL LOW (ref 3.8–10.8)

## 2023-03-16 LAB — TSH: TSH: 1.54 m[IU]/L (ref 0.40–4.50)

## 2023-03-16 LAB — LIPID PANEL
Cholesterol: 183 mg/dL (ref <200)
HDL: 78 mg/dL (ref >=50)
LDL Cholesterol (Calc): 88 mg/dL
Non-HDL Cholesterol (Calc): 105 mg/dL (ref <130)
Total CHOL/HDL Ratio: 2.3 (calc) (ref <5.0)
Triglycerides: 78 mg/dL (ref <150)

## 2023-03-23 ENCOUNTER — Encounter: Payer: Self-pay | Admitting: Family

## 2023-03-23 ENCOUNTER — Ambulatory Visit (INDEPENDENT_AMBULATORY_CARE_PROVIDER_SITE_OTHER): Payer: Medicare Other | Admitting: Family

## 2023-03-23 VITALS — BP 116/70 | HR 98 | Temp 97.0°F | Resp 20 | Ht 62.0 in | Wt 86.6 lb

## 2023-03-23 DIAGNOSIS — H6121 Impacted cerumen, right ear: Secondary | ICD-10-CM

## 2023-03-23 DIAGNOSIS — F5101 Primary insomnia: Secondary | ICD-10-CM | POA: Diagnosis not present

## 2023-03-23 DIAGNOSIS — F32A Depression, unspecified: Secondary | ICD-10-CM

## 2023-03-23 DIAGNOSIS — F419 Anxiety disorder, unspecified: Secondary | ICD-10-CM | POA: Diagnosis not present

## 2023-03-23 NOTE — Progress Notes (Signed)
Provider: Richarda Blade FNP-C  Kenneth Lax, [name], NP  Patient Care Team: Gurkirat Basher, [name], NP as PCP - General (Family Medicine) Antony Contras, MD as Consulting Physician (Ophthalmology)  Extended Emergency Contact Information Primary Emergency Contact: [name],[name] Address: [address] [place] [address]  [address] Home Phone: [phone] Relation: Son Secondary Emergency Contact: [name] of [place] Home Phone: [phone] Mobile Phone: [phone] Relation: Sister  Code Status:  Full Code  Goals of care: Advanced Directive information    03/15/2023    1:18 PM  Advanced Directives  Does Patient Have a Medical Advance Directive? Yes  Type of Estate agent of [place];Living will  Does patient want to make changes to medical advance directive? No - Patient declined  Copy of Healthcare Power of Attorney in Chart? Yes - validated most recent copy scanned in chart (See row information)     Chief Complaint  Patient presents with   Acute Visit    Patient presents today for right ear lavage    HPI:  Pt is a 77 y.o. female seen today for an acute visit for right ear lavage.she was here 03/15/2023 cerumen impaction was noted. She was advised to instil debrox 6.5 % otic solution 5 drops into right ear twice daily x 4 days then follow up today for ear lavage.she denies any pain in the ear,ringing,change in hearing,fever or chills.   States hydroxyzine prescribed on last visit has helped with anxiety and sleeps well at night not hearing the voices like she used to.    Past Medical History:  Diagnosis Date   H/O mammogram 2022   patient does Mammogram annually.   Hx of colonoscopy 2018   2008 as well   Hypertension    Past Surgical History:  Procedure Laterality Date   CATARACT EXTRACTION Bilateral 2018   Antony Contras    Allergies  Allergen Reactions   Sertraline Hcl Other (See Comments)    Outpatient Encounter  Medications as of 03/23/2023  Medication Sig   amLODipine (NORVASC) 5 MG tablet Take 1 tablet by mouth once daily   Calcium Citrate-Vitamin D (CALCIUM CITRATE + D) 250-5 MG-MCG TABS Take 1 tablet by mouth daily.   DULoxetine (CYMBALTA) 20 MG capsule Take 1 capsule by mouth once daily   hydrOXYzine (ATARAX) 10 MG tablet Take 1 tablet (10 mg total) by mouth 3 (three) times daily as needed.   OVER THE COUNTER MEDICATION Take 1 capsule by mouth daily. Calcium with Vitamin B12 ... 600mg .   Please confirm with patient at appointment if Calcium is 600mg  and Vitamin B12 MG?   Polyethyl Glycol-Propyl Glycol 0.4-0.3 % SOLN Apply 1 drop to eye as needed.   No facility-administered encounter medications on file as of 03/23/2023.    Review of Systems  Constitutional:  Negative for appetite change, chills, fatigue, fever and unexpected weight change.  HENT:  Negative for congestion, ear discharge, ear pain, facial swelling, hearing loss, nosebleeds, postnasal drip, rhinorrhea, sinus pressure, sinus pain, sneezing, sore throat, tinnitus and trouble swallowing.   Eyes:  Negative for pain, discharge, redness, itching and visual disturbance.  Respiratory:  Negative for cough, chest tightness, shortness of breath and wheezing.   Cardiovascular:  Negative for chest pain, palpitations and leg swelling.  Musculoskeletal:  Negative for arthralgias, back pain, gait problem, joint swelling, myalgias, neck pain and neck stiffness.  Skin:  Negative for color change, pallor, rash and wound.  Neurological:  Negative for dizziness, light-headedness and headaches.  Psychiatric/Behavioral:  Negative for agitation, behavioral problems, confusion, self-injury, sleep disturbance and suicidal ideas.        Anxiety and hallucination has improved with recently prescribed hydroxyzine. Also sleeping well.     Immunization History  Administered Date(s) Administered   Fluad Trivalent(High Dose 65+) 03/15/2023   Influenza Split  04/03/2011, 04/12/2012, 01/02/2019, 01/24/2020   Influenza, High Dose Seasonal PF 01/10/2021, 01/28/2022   Influenza,inj,Quad PF,6+ Mos 01/31/2016, 02/26/2017, 02/11/2018   Influenza,inj,quad, With Preservative 02/16/2014, 03/08/2015   Moderna Covid-19 Fall Seasonal Vaccine 75yrs & older 03/10/2022, 03/06/2023   Moderna SARS-COV2 Booster Vaccination 03/10/2022   PFIZER(Purple Top)SARS-COV-2 Vaccination 05/19/2019, 06/09/2019, 03/04/2020, 11/13/2020   Pfizer Covid-19 Vaccine Bivalent Booster 50yrs & up 02/05/2021   Pneumococcal Conjugate-13 05/30/2014   Pneumococcal Polysaccharide-23 04/12/2012   Respiratory Syncytial Virus Vaccine,Recomb Aduvanted(Arexvy) 01/28/2022   Tdap 09/28/2008   Tetanus 03/12/2022   Zoster Recombinant(Shingrix) 12/22/2018, 03/01/2019   Zoster, Live 04/28/2012   Pertinent  Health Maintenance Due  Topic Date Due   INFLUENZA VACCINE  Completed   DEXA SCAN  Completed   Colonoscopy  Discontinued      10/07/2021   10:05 AM 03/09/2022   10:25 AM 03/16/2022    8:26 AM 09/07/2022    1:06 PM 03/15/2023    1:16 PM  Fall Risk  Falls in the past year? 0 0 0 0 1  Was there an injury with Fall? 0 0 0 0 1  Fall Risk Category Calculator 0 0 0 0 2  Fall Risk Category (Retired) Low Low Low    (RETIRED) Patient Fall Risk Level Low fall risk Low fall risk Low fall risk    Patient at Risk for Falls Due to No Fall Risks No Fall Risks No Fall Risks No Fall Risks   Fall risk Follow up Falls evaluation completed Falls evaluation completed Falls evaluation completed Falls evaluation completed    Functional Status Survey:    Vitals:   03/23/23 1441  BP: 116/70  Pulse: 98  Resp: 20  Temp: (!) 97 F (36.1 C)  SpO2: 97%  Weight: 86 lb 9.6 oz (39.3 kg)  Height: 5\' 2"  (1.575 m)   Body mass index is 15.84 kg/m. Physical Exam Vitals reviewed.  Constitutional:      General: She is not in acute distress.    Appearance: Normal appearance. She is underweight. She is not  ill-appearing or diaphoretic.  HENT:     Head: Normocephalic.     Right Ear: There is impacted cerumen.     Left Ear: Tympanic membrane, ear canal and external ear normal. There is no impacted cerumen.     Ears:     Comments: Right ear cerumen lavaged with warm water and hydrogen peroxide moderate amounts of cerumen obtained.No instrument used to remove cerumen.Tolerated procedure well.TM clear without any signs of infection.     Nose: Nose normal. No congestion or rhinorrhea.     Mouth/Throat:     Mouth: Mucous membranes are moist.     Pharynx: Oropharynx is clear. No oropharyngeal exudate or posterior oropharyngeal erythema.  Eyes:     General: No scleral icterus.       Right eye: No discharge.        Left eye: No discharge.     Extraocular Movements: Extraocular movements intact.     Conjunctiva/sclera: Conjunctivae normal.     Pupils: Pupils are equal, round, and reactive to light.  Neck:     Vascular: No  carotid bruit.  Cardiovascular:     Rate and Rhythm: Normal rate and regular rhythm.     Pulses: Normal pulses.     Heart sounds: Normal heart sounds. No murmur heard.    No friction rub. No gallop.  Pulmonary:     Effort: Pulmonary effort is normal. No respiratory distress.     Breath sounds: Normal breath sounds. No wheezing, rhonchi or rales.  Chest:     Chest wall: No tenderness.  Musculoskeletal:     Cervical back: Normal range of motion. No rigidity or tenderness.  Lymphadenopathy:     Cervical: No cervical adenopathy.  Skin:    General: Skin is warm and dry.     Coloration: Skin is not pale.     Findings: No erythema or rash.  Neurological:     Mental Status: She is alert and oriented to person, place, and time.     Cranial Nerves: No cranial nerve deficit.     Gait: Gait normal.  Psychiatric:        Mood and Affect: Mood normal.        Speech: Speech normal.        Behavior: Behavior normal.        Thought Content: Thought content normal.        Judgment:  Judgment normal.     Labs reviewed: Recent Labs    09/08/22 0826 03/15/23 1415  NA 140 140  K 4.3 4.7  CL 104 103  CO2 31 30  GLUCOSE 89 93  BUN 16 17  CREATININE 0.58* 0.60  CALCIUM 9.1 9.5   Recent Labs    09/08/22 0826 03/15/23 1415  AST 18 24  ALT 12 19  BILITOT 0.5 0.4  PROT 7.2 7.7   Recent Labs    09/08/22 0826 03/15/23 1415  WBC 2.8* 3.1*  NEUTROABS 1,229* 1,500  HGB 13.1 13.7  HCT 39.4 41.4  MCV 93.8 93.5  PLT 186 211   Lab Results  Component Value Date   TSH 1.54 03/15/2023   No results found for: "HGBA1C" Lab Results  Component Value Date   CHOL 183 03/15/2023   HDL 78 03/15/2023   LDLCALC 88 03/15/2023   TRIG 78 03/15/2023   CHOLHDL 2.3 03/15/2023    Significant Diagnostic Results in last 30 days:  No results found.  Assessment/Plan 1. Impacted cerumen of right ear Afebrile  Right ear cerumen lavaged with warm water and hydrogen peroxide moderate amounts of cerumen obtained.No instrument used to remove cerumen.Tolerated procedure well.TM clear without any signs of infection. - Advised to notify provide for any signs of infection   2. Anxiety and depression Symptoms have improved with recently prescribed hydroxyzine   3. Primary insomnia Continue on hydroxyzine   Family/ staff Communication: Reviewed plan of care with patient verbalized understanding   Labs/tests ordered: None   Next Appointment: Return if symptoms worsen or fail to improve.   Caesar Bookman, NP

## 2023-04-12 ENCOUNTER — Other Ambulatory Visit: Payer: Self-pay | Admitting: Family

## 2023-04-12 DIAGNOSIS — I10 Essential (primary) hypertension: Secondary | ICD-10-CM

## 2023-04-12 DIAGNOSIS — F32A Depression, unspecified: Secondary | ICD-10-CM

## 2023-09-13 ENCOUNTER — Ambulatory Visit: Payer: Medicare Other | Admitting: Family
